# Patient Record
Sex: Female | Born: 1995 | Race: White | Hispanic: No | Marital: Single | Smoking: Never smoker
Health system: Southern US, Community
[De-identification: ages and names within clinical notes are randomized; demographics above are authoritative.]

## PROBLEM LIST (undated history)

## (undated) ENCOUNTER — Inpatient Hospital Stay (HOSPITAL_COMMUNITY): Payer: Self-pay

## (undated) DIAGNOSIS — N390 Urinary tract infection, site not specified: Secondary | ICD-10-CM

## (undated) DIAGNOSIS — K589 Irritable bowel syndrome without diarrhea: Secondary | ICD-10-CM

## (undated) DIAGNOSIS — J302 Other seasonal allergic rhinitis: Secondary | ICD-10-CM

## (undated) DIAGNOSIS — T7840XA Allergy, unspecified, initial encounter: Secondary | ICD-10-CM

## (undated) DIAGNOSIS — F32A Depression, unspecified: Secondary | ICD-10-CM

## (undated) DIAGNOSIS — F419 Anxiety disorder, unspecified: Secondary | ICD-10-CM

## (undated) HISTORY — DX: Urinary tract infection, site not specified: N39.0

## (undated) HISTORY — DX: Allergy, unspecified, initial encounter: T78.40XA

## (undated) HISTORY — DX: Irritable bowel syndrome, unspecified: K58.9

## (undated) HISTORY — DX: Anxiety disorder, unspecified: F41.9

## (undated) HISTORY — DX: Other seasonal allergic rhinitis: J30.2

## (undated) HISTORY — DX: Depression, unspecified: F32.A

---
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
## 2014-04-15 ENCOUNTER — Encounter: Payer: Self-pay | Admitting: Podiatry

## 2014-04-15 ENCOUNTER — Other Ambulatory Visit: Payer: Self-pay | Admitting: Podiatry

## 2015-01-28 ENCOUNTER — Encounter (HOSPITAL_BASED_OUTPATIENT_CLINIC_OR_DEPARTMENT_OTHER): Payer: Self-pay | Admitting: Emergency Medicine

## 2015-01-28 ENCOUNTER — Emergency Department (HOSPITAL_BASED_OUTPATIENT_CLINIC_OR_DEPARTMENT_OTHER)
Admission: EM | Admit: 2015-01-28 | Discharge: 2015-01-29 | Disposition: A | Discharge status: Home / Self Care | Payer: 59 | Attending: Emergency Medicine | Admitting: Emergency Medicine

## 2015-01-28 DIAGNOSIS — Z880 Allergy status to penicillin: Secondary | ICD-10-CM | POA: Diagnosis not present

## 2015-01-28 DIAGNOSIS — Y9289 Other specified places as the place of occurrence of the external cause: Secondary | ICD-10-CM | POA: Insufficient documentation

## 2015-01-28 DIAGNOSIS — Z3202 Encounter for pregnancy test, result negative: Secondary | ICD-10-CM | POA: Insufficient documentation

## 2015-01-28 DIAGNOSIS — R110 Nausea: Secondary | ICD-10-CM | POA: Insufficient documentation

## 2015-01-28 DIAGNOSIS — S39012A Strain of muscle, fascia and tendon of lower back, initial encounter: Secondary | ICD-10-CM | POA: Diagnosis not present

## 2015-01-28 DIAGNOSIS — Z87440 Personal history of urinary (tract) infections: Secondary | ICD-10-CM | POA: Insufficient documentation

## 2015-01-28 DIAGNOSIS — Z79899 Other long term (current) drug therapy: Secondary | ICD-10-CM | POA: Insufficient documentation

## 2015-01-28 DIAGNOSIS — Y998 Other external cause status: Secondary | ICD-10-CM | POA: Diagnosis not present

## 2015-01-28 DIAGNOSIS — Y9389 Activity, other specified: Secondary | ICD-10-CM | POA: Diagnosis not present

## 2015-01-28 DIAGNOSIS — R109 Unspecified abdominal pain: Secondary | ICD-10-CM | POA: Diagnosis present

## 2015-01-28 DIAGNOSIS — X58XXXA Exposure to other specified factors, initial encounter: Secondary | ICD-10-CM | POA: Diagnosis not present

## 2015-01-28 LAB — URINALYSIS, ROUTINE W REFLEX MICROSCOPIC
Bilirubin Urine: NEGATIVE
Glucose, UA: NEGATIVE mg/dL
Hgb urine dipstick: NEGATIVE
Ketones, ur: NEGATIVE mg/dL
Leukocytes, UA: NEGATIVE
Nitrite: NEGATIVE
Protein, ur: NEGATIVE mg/dL
Specific Gravity, Urine: 1.016 (ref 1.005–1.030)
Urobilinogen, UA: 1 mg/dL (ref 0.0–1.0)
pH: 7 (ref 5.0–8.0)

## 2015-01-28 LAB — PREGNANCY, URINE: Preg Test, Ur: NEGATIVE

## 2015-01-28 MED ORDER — IBUPROFEN 400 MG PO TABS
600.0000 mg | ORAL_TABLET | Freq: Once | ORAL | Status: AC
  Administered 2015-01-28: 600 mg via ORAL
  Filled 2015-01-28 (×2): qty 1

## 2015-01-28 MED ORDER — IBUPROFEN 600 MG PO TABS: 600.0000 mg | ORAL_TABLET | Freq: Four times a day (QID) | ORAL | Status: DC | PRN

## 2015-01-28 MED ORDER — METHOCARBAMOL 500 MG PO TABS
1000.0000 mg | ORAL_TABLET | Freq: Once | ORAL | Status: AC
  Administered 2015-01-28: 1000 mg via ORAL
  Filled 2015-01-28: qty 2

## 2015-01-28 MED ORDER — METHOCARBAMOL 500 MG PO TABS: 1000.0000 mg | ORAL_TABLET | Freq: Three times a day (TID) | ORAL | Status: DC | PRN

## 2015-01-28 NOTE — ED Provider Notes (Signed)
CSN: [id number]     Arrival date & time 01/28/15  2205 History  This chart was scribed for Loren Raceravid Khilynn Borntreger, MD by Murriel HopperAlec Bankhead, ED Scribe. This patient was seen in room MH03/MH03 and the patient's care was started at 11:36 PM.  Chief Complaint  Patient presents with  . Flank Pain      Patient is a 19 y.o. female presenting with flank pain. The history is provided by the patient and a parent. No language interpreter was used.  Flank Pain This is a new problem. The current episode started more than 2 days ago. The problem occurs constantly. The problem has not changed since onset.Pertinent negatives include no abdominal pain, no headaches and no shortness of breath. The symptoms are aggravated by twisting, stress and exertion.    HPI Comments: Kimberly Fuentes is a 19 y.o. female who presents to the Emergency Department complaining of intermittent left flank pain with associated dysuria and nausea that has been present for about a week. Her mother states that she was diagnosed with a UTI last week and received antibiotics for them, for which she has 2 days of antibiotics left. Pt states that her dysuria is improving, and notes she has not had any urinary symptoms today, but notes that her flank pain is still persistent. Pt reports she tried to play in a softball game earlier this evening and could not play because of her flank pain. Pain does not radiate. No focal weakness or numbness. No incontinence     History reviewed. No pertinent past medical history. Past Surgical History  Procedure Laterality Date  . Wisdom tooth extraction  2012   No family history on file. History  Substance Use Topics  . Smoking status: Passive Smoke Exposure - Never Smoker  . Smokeless tobacco: Not on file  . Alcohol Use: No   OB History    No data available     Review of Systems  Constitutional: Negative for fever and chills.  Respiratory: Negative for shortness of breath.   Gastrointestinal:  Positive for nausea. Negative for vomiting, abdominal pain and diarrhea.  Genitourinary: Positive for flank pain. Negative for dysuria, frequency, difficulty urinating and pelvic pain.  Musculoskeletal: Positive for myalgias and back pain. Negative for neck pain.  Skin: Negative for rash and wound.  Neurological: Negative for dizziness, weakness, light-headedness, numbness and headaches.  All other systems reviewed and are negative.     Allergies  Cefazolin and Penicillins  Home Medications   Prior to Admission medications   Medication Sig Start Date End Date Taking? Authorizing Provider  ibuprofen (ADVIL,MOTRIN) 600 MG tablet Take 1 tablet (600 mg total) by mouth every 6 (six) hours as needed. 01/28/15   Loren Raceravid Tequita Marrs, MD  methocarbamol (ROBAXIN) 500 MG tablet Take 2 tablets (1,000 mg total) by mouth every 8 (eight) hours as needed for muscle spasms. 01/28/15   Loren Raceravid Praise Stennett, MD  nitrofurantoin (MACRODANTIN) 100 MG capsule Take 100 mg by mouth 2 (two) times daily.   Yes Historical Provider, MD  ondansetron (ZOFRAN) 4 MG tablet Take 4 mg by mouth every 8 (eight) hours as needed for nausea.   Yes Historical Provider, MD   BP 129/65 mmHg  Pulse 65  Temp(Src) 98.7 F (37.1 C) (Oral)  Resp 16  Ht 5\' 1"  (1.549 m)  Wt 130 lb (58.968 kg)  BMI 24.58 kg/m2  SpO2 100%  LMP 01/14/2015 (Exact Date) Physical Exam  Constitutional: She is oriented to person, place, and time. She appears well-developed  and well-nourished. No distress.  Very well-appearing  HENT:  Head: Normocephalic and atraumatic.  Mouth/Throat: Oropharynx is clear and moist.  Eyes: EOM are normal. Pupils are equal, round, and reactive to light.  Neck: Normal range of motion. Neck supple.  Cardiovascular: Normal rate and regular rhythm.   Pulmonary/Chest: Effort normal and breath sounds normal. No respiratory distress. She has no wheezes. She has no rales.  Abdominal: Soft. Bowel sounds are normal. She exhibits no  distension and no mass. There is no tenderness. There is no rebound and no guarding.  Musculoskeletal: Normal range of motion. She exhibits tenderness. She exhibits no edema.  No CVA tenderness bilaterally. The patient does have inferior left-sided paraspinal lumbar tenderness. No midline tenderness or step-offs.  Neurological: She is alert and oriented to person, place, and time.  Skin: Skin is warm and dry. No rash noted. No erythema.  Psychiatric: She has a normal mood and affect. Her behavior is normal.  Nursing note and vitals reviewed.   ED Course  Procedures (including critical care time)  DIAGNOSTIC STUDIES: Oxygen Saturation is 100% on room air, normal by my interpretation.    COORDINATION OF CARE: 11:39 PM Discussed treatment plan with pt at bedside and pt agreed to plan.   Labs Review Labs Reviewed  URINALYSIS, ROUTINE W REFLEX MICROSCOPIC (NOT AT [facility]) - Abnormal; Notable for the following:    APPearance CLOUDY (*)    All other components within normal limits  PREGNANCY, URINE    Imaging Review No results found.   EKG Interpretation None      MDM   Final diagnoses:  Lumbar strain, initial encounter    I personally performed the services described in this documentation, which was scribed in my presence. The recorded information has been reviewed and is accurate.  Urine without evidence of infection. Patient is tender over the paraspinal muscles in the lumbar region. Symptoms are likely due to muscle strain/spasm. Heat use as advised. We'll treat symptomatically. Return precautions given.     Loren Racer, MD 01/29/15 [phone]

## 2015-01-28 NOTE — Discharge Instructions (Signed)

## 2015-01-28 NOTE — ED Notes (Signed)
19 yo dx with severe UTI last week. C/o left flank pain and persistent burning with urination.

## 2015-03-27 ENCOUNTER — Ambulatory Visit (INDEPENDENT_AMBULATORY_CARE_PROVIDER_SITE_OTHER): Payer: 59 | Admitting: Family Medicine

## 2015-03-27 ENCOUNTER — Encounter: Payer: Self-pay | Admitting: Family Medicine

## 2015-03-27 VITALS — BP 110/74 | HR 77 | Temp 98.2°F | Resp 18 | Ht 62.5 in | Wt 128.0 lb

## 2015-03-27 DIAGNOSIS — F633 Trichotillomania: Secondary | ICD-10-CM | POA: Diagnosis not present

## 2015-03-27 DIAGNOSIS — J019 Acute sinusitis, unspecified: Secondary | ICD-10-CM | POA: Insufficient documentation

## 2015-03-27 HISTORY — DX: Trichotillomania: F63.3

## 2015-03-27 MED ORDER — FLUTICASONE PROPIONATE 50 MCG/ACT NA SUSP: 2.0000 | Freq: Every day | NASAL | Status: DC

## 2015-03-27 NOTE — Patient Instructions (Signed)
Upper Respiratory Infection, Adult An upper respiratory infection (URI) is also sometimes known as the common cold. The upper respiratory tract includes the nose, sinuses, throat, trachea, and bronchi. Bronchi are the airways leading to the lungs. Most people improve within 1 week, but symptoms can last up to 2 weeks. A residual cough may last even longer.  CAUSES Many different viruses can infect the tissues lining the upper respiratory tract. The tissues become irritated and inflamed and often become very moist. Mucus production is also common. A cold is contagious. You can easily spread the virus to others by oral contact. This includes kissing, sharing a glass, coughing, or sneezing. Touching your mouth or nose and then touching a surface, which is then touched by another person, can also spread the virus. SYMPTOMS  Symptoms typically develop 1 to 3 days after you come in contact with a cold virus. Symptoms vary from person to person. They may include:  Runny nose.  Sneezing.  Nasal congestion.  Sinus irritation.  Sore throat.  Loss of voice (laryngitis).  Cough.  Fatigue.  Muscle aches.  Loss of appetite.  Headache.  Low-grade fever. DIAGNOSIS  You might diagnose your own cold based on familiar symptoms, since most people get a cold 2 to 3 times a year. Your caregiver can confirm this based on your exam. Most importantly, your caregiver can check that your symptoms are not due to another disease such as strep throat, sinusitis, pneumonia, asthma, or epiglottitis. Blood tests, throat tests, and X-rays are not necessary to diagnose a common cold, but they may sometimes be helpful in excluding other more serious diseases. Your caregiver will decide if any further tests are required. RISKS AND COMPLICATIONS  You may be at risk for a more severe case of the common cold if you smoke cigarettes, have chronic heart disease (such as heart failure) or lung disease (such as asthma), or if  you have a weakened immune system. The very young and very old are also at risk for more serious infections. Bacterial sinusitis, middle ear infections, and bacterial pneumonia can complicate the common cold. The common cold can worsen asthma and chronic obstructive pulmonary disease (COPD). Sometimes, these complications can require emergency medical care and may be life-threatening. PREVENTION  The best way to protect against getting a cold is to practice good hygiene. Avoid oral or hand contact with people with cold symptoms. Wash your hands often if contact occurs. There is no clear evidence that vitamin C, vitamin E, echinacea, or exercise reduces the chance of developing a cold. However, it is always recommended to get plenty of rest and practice good nutrition. TREATMENT  Treatment is directed at relieving symptoms. There is no cure. Antibiotics are not effective, because the infection is caused by a virus, not by bacteria. Treatment may include:  Increased fluid intake. Sports drinks offer valuable electrolytes, sugars, and fluids.  Breathing heated mist or steam (vaporizer or shower).  Eating chicken soup or other clear broths, and maintaining good nutrition.  Getting plenty of rest.  Using gargles or lozenges for comfort.  Controlling fevers with ibuprofen or acetaminophen as directed by your caregiver.  Increasing usage of your inhaler if you have asthma. Zinc gel and zinc lozenges, taken in the first 24 hours of the common cold, can shorten the duration and lessen the severity of symptoms. Pain medicines may help with fever, muscle aches, and throat pain. A variety of non-prescription medicines are available to treat congestion and runny nose. Your caregiver   can make recommendations and may suggest nasal or lung inhalers for other symptoms.  HOME CARE INSTRUCTIONS   Only take over-the-counter or prescription medicines for pain, discomfort, or fever as directed by your  caregiver.  Use a warm mist humidifier or inhale steam from a shower to increase air moisture. This may keep secretions moist and make it easier to breathe.  Drink enough water and fluids to keep your urine clear or pale yellow.  Rest as needed.  Return to work when your temperature has returned to normal or as your caregiver advises. You may need to stay home longer to avoid infecting others. You can also use a face mask and careful hand washing to prevent spread of the virus. SEEK MEDICAL CARE IF:   After the first few days, you feel you are getting worse rather than better.  You need your caregiver's advice about medicines to control symptoms.  You develop chills, worsening shortness of breath, or brown or red sputum. These may be signs of pneumonia.  You develop yellow or brown nasal discharge or pain in the face, especially when you bend forward. These may be signs of sinusitis.  You develop a fever, swollen neck glands, pain with swallowing, or white areas in the back of your throat. These may be signs of strep throat. SEEK IMMEDIATE MEDICAL CARE IF:   You have a fever.  You develop severe or persistent headache, ear pain, sinus pain, or chest pain.  You develop wheezing, a prolonged cough, cough up blood, or have a change in your usual mucus (if you have chronic lung disease).  You develop sore muscles or a stiff neck. Document Released: 01/05/2001 Document Revised: 10/04/2011 Document Reviewed: 10/17/2013 ExitCare Patient Information 2015 [employer]. This information is not intended to replace advice given to you by your health care provider. Make sure you discuss any questions you have with your health care provider.  

## 2015-03-27 NOTE — Progress Notes (Signed)
Subjective:    Patient ID: Kimberly Fuentes, female    DOB: 11/20/1995, 19 y.o.   MRN: [id number]  HPI  Patient presents to establish care with complaints of sore throat  Sore throat: Patient states that she's had a sore throat for approximately 7 days, with rhinorrhea, congestion, mild facial pressure in her forehead with headache. She denies any exposure to sick contacts. She does not take an histamine, but has tried Sudafed and a cold and sinus medication. She denies any fever, chills, rash, diarrhea, nausea, vomiting and she is tolerating by mouth.  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 Initially she would pull out her eyelashes, but she has been able to resist the temptation's. She is however started pulling on her hair, and she is "working "on this now.  Patient is a new patient, all medical history was recorded today, including past medical history, allergies, past surgical history, family history and social history.  Past Medical History  Diagnosis Date  . UTI (lower urinary tract infection)   . Allergy    Allergies  Allergen Reactions  . Cefazolin Hives  . Penicillins Hives  . Hydrocodone Itching    Facial itching when taking after wisdom teeth extraction   Past Surgical History  Procedure Laterality Date  . Wisdom tooth extraction  2012  . Tonsillectomy and adenoidectomy  2000   Family History  Problem Relation Age of Onset  . Miscarriages / [place] Mother    Social History   Social History  . Marital Status: Single    Spouse Name: N/A  . Number of Children: N/A  . Years of Education: N/A   Occupational History  . Not on file.   Social History Main Topics  . Smoking status: Passive Smoke Exposure - Never Smoker  . Smokeless tobacco: Not on file  . Alcohol Use: No  . Drug Use: No  . Sexual Activity: Yes    Birth Control/ Protection: None, Condom   Other Topics  Concern  . Not on file   Social History Narrative   Wears seat belt, wears bike helmet. Feels safe. Works at [employer]. Archivist, TEFL teacher.     Review of Systems  Constitutional: Positive for activity change and fatigue. Negative for fever, chills, appetite change and unexpected weight change.  HENT: Positive for congestion, ear pain, postnasal drip, rhinorrhea, sinus pressure and sore throat. Negative for dental problem, ear discharge, facial swelling, hearing loss, nosebleeds, sneezing, tinnitus, trouble swallowing and voice change.   Eyes: Negative for pain, redness and visual disturbance.  Respiratory: Negative for cough, chest tightness, shortness of breath and wheezing.   Cardiovascular: Negative for chest pain, palpitations and leg swelling.  Gastrointestinal: Negative for nausea, vomiting, abdominal pain, diarrhea and constipation.  Endocrine: Negative for polyuria.  Genitourinary: Negative for dysuria, urgency, frequency and hematuria.  Musculoskeletal: Negative for myalgias and arthralgias.  Skin: Negative for rash.  Allergic/Immunologic: Negative.   Neurological: Positive for headaches. Negative for weakness.  Hematological: Negative.       Objective:   Physical Exam BP 110/74 mmHg  Pulse 77  Temp(Src) 98.2 F (36.8 C) (Oral)  Resp 18  Ht 5' 2.5" (1.588 m)  Wt 128 lb (58.06 kg)  BMI 23.02 kg/m2  SpO2 98%  LMP 03/14/2015 Gen: Afebrile. No acute distress. Appears fatigued, coughing mildly dry on exam. Congestion during exam. HENT: AT. [place]. Bilateral TM visualized and normal in appearance.  MMM. Bilateral nares with erythema and swelling. Throat without erythema or exudates.  Eyes:Pupils Equal Round Reactive to light, Extraocular movements intact, Conjunctiva without redness, discharge or icterus. Neck: Supple, mild cervical anterior lymphadenopathy CV: RRR no murmur appreciated Chest: CTAB, no wheeze or crackles Abd: Soft. Flat. NTND. BS present. No Masses  palpated.  Skin: No rashes, purpura or petechiae.      Assessment & Plan:  1. 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  2. Acute rhinosinusitis - Rest, hydrate, Advil as needed, Mucinex over-the-counter, Flonase. - fluticasone (FLONASE) 50 MCG/ACT nasal spray; Place 2 sprays into both nostrils daily.  Dispense: 16 g; Refill: 1 - Follow-up in one week if no improvement.  3. Health maintenance: Patient declined flu shot today, records will need to be retrieved to see if she needs the tetanus shot.

## 2015-05-01 ENCOUNTER — Encounter: Payer: 59 | Admitting: Family Medicine

## 2015-05-01 ENCOUNTER — Ambulatory Visit (INDEPENDENT_AMBULATORY_CARE_PROVIDER_SITE_OTHER): Payer: 59 | Admitting: Family Medicine

## 2015-05-01 ENCOUNTER — Encounter: Payer: Self-pay | Admitting: Family Medicine

## 2015-05-01 VITALS — BP 112/77 | HR 77 | Temp 98.4°F | Resp 18 | Ht 62.0 in | Wt 129.0 lb

## 2015-05-01 DIAGNOSIS — Z23 Encounter for immunization: Secondary | ICD-10-CM

## 2015-05-01 DIAGNOSIS — Z0000 Encounter for general adult medical examination without abnormal findings: Secondary | ICD-10-CM | POA: Insufficient documentation

## 2015-05-01 DIAGNOSIS — Z309 Encounter for contraceptive management, unspecified: Secondary | ICD-10-CM

## 2015-05-01 DIAGNOSIS — Z299 Encounter for prophylactic measures, unspecified: Secondary | ICD-10-CM | POA: Diagnosis not present

## 2015-05-01 DIAGNOSIS — F633 Trichotillomania: Secondary | ICD-10-CM

## 2015-05-01 DIAGNOSIS — Z3009 Encounter for other general counseling and advice on contraception: Secondary | ICD-10-CM | POA: Insufficient documentation

## 2015-05-01 LAB — POCT URINE PREGNANCY: Preg Test, Ur: NEGATIVE

## 2015-05-01 MED ORDER — MEDROXYPROGESTERONE ACETATE 150 MG/ML IM SUSP
150.0000 mg | Freq: Once | INTRAMUSCULAR | Status: AC
  Administered 2015-05-01: 150 mg via INTRAMUSCULAR

## 2015-05-01 NOTE — Progress Notes (Signed)
Subjective:    Patient ID: Kimberly Fuentes, female    DOB: 1995-08-12, 19 y.o.   MRN: [id number]  HPI Patient presents today for her physical, with questions of birth control counseling, otherwise patient has no complaints.Past medical history, family history, medications, immunizations, surgical history and social history were updated during the office visit.  Birth control counseling: Patient states she would like to be started on birth control method. She does not feel that she could remember to take a birth control pill every day. After counseling, she is not interested in any type of implant. She thinks the Depo-Provera injection every 3 months would work best for her. Currently she states her menstrual period is irregular, it can occur anywhere between 21 and 45 days. She states her menstrual cycles have always been this way. They last for approximately 1 week. She states they are only heavy on the third day and then they are normal. She denies any vaginal irritation, discharge or pain. She has become sexually active, in a monogamous relationship with a female partner. She has no breast or colon cancer history in her family. She is currently using condoms for protection.  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 She has been seeing a psychiatrist at school. She has transferred back to the [place] area, and is starting to go to [facility]. She is asking for referral to psychiatrist in the area. She states she was tried on Prozac in the past, she is uncertain about the dosage. She did not feel that the Prozac was helpful for her. She does get great benefit out of therapy. She states her last "pulling" occurred 1 week ago. She has been starting to pull the hair on her scalp.  Health maintenance:  Colonoscopy: No family history, routine screening at age 68 Mammogram: No family history, routine screening at age 46-50 Cervical cancer screening: Not indicated, start at 89 with  Pap smears Immunizations: Td UTD due 2019, flu vaccination indicated.  Infectious disease screening: Chlamydia screening if needed, and with Pap smears at 21. HIV will be offered with next lab draw.  Past Medical History  Diagnosis Date  . UTI (lower urinary tract infection)   . Allergy    Allergies  Allergen Reactions  . Cefazolin Hives  . Penicillins Hives  . Hydrocodone Itching    Facial itching when taking after wisdom teeth extraction   Past Surgical History  Procedure Laterality Date  . Wisdom tooth extraction  2012  . Tonsillectomy and adenoidectomy  2000    Family History  Problem Relation Age of Onset  . Miscarriages / [place] Mother    Social History   Social History  . Marital Status: Single    Spouse Name: N/A  . Number of Children: N/A  . Years of Education: N/A   Occupational History  . Not on file.   Social History Main Topics  . Smoking status: Passive Smoke Exposure - Never Smoker  . Smokeless tobacco: Not on file  . Alcohol Use: No  . Drug Use: No  . Sexual Activity: Yes    Birth Control/ Protection: None, Condom, Injection     Comment: depo   Other Topics Concern  . Not on file   Social History Narrative   Wears seat belt, wears bike helmet. Feels safe in her relationships. Works at [employer].    Designer, television/film set, applying for surgical tech   - [employer]   - No etoh, drugs or smoker.    -  In a relationship   - Patient has a dental home, with every 6 month check ups.   Review of Systems Negative, with the exception of above mentioned in HPI     Objective:   Physical Exam BP 112/77 mmHg  Pulse 77  Temp(Src) 98.4 F (36.9 C) (Oral)  Resp 18  Ht  (1.575 m)  Wt 129 lb (58.514 kg)  BMI 23.59 kg/m2  SpO2 98%  LMP 04/06/2015 Gen: Afebrile. No acute distress. Nontoxic appearance, well-developed, well-nourished, Caucasian female. Physically fit. Pleasant. Makes good eye contact. Eyelashes and eyebrows intact. Hair appears  thin. HENT: AT. [place]. Bilateral TM visualized and normal in appearance. MMM. Bilateral nares without erythema or swelling. Throat without erythema or exudates.  Eyes:Pupils Equal Round Reactive to light, Extraocular movements intact,  Conjunctiva without redness, discharge or icterus. Neck/lymp/endocrine: Supple, no lymphadenopathy, no thyromegaly CV: RRR no murmur appreciated, no edema, +2/4 P posterior tibialis pulses Chest: CTAB, no wheeze or crackles Abd: Soft. Flat. NTND. BS present. No Masses palpated.  Skin: No rashes, purpura or petechiae. ~ 5 caf au lait spots over her body. Neuro: Normal gait. PERLA. EOMi. Alert. Oriented x3.  Cranial nerves II through XII intact. No focal deficits. Muscle strength 5/5 upper and lower extremity. DTRs equal bilaterally. Psych: Normal affect, dress and demeanor. Normal speech. Normal thought content and judgment..      Assessment & Plan:  1. Need for prophylactic vaccination and inoculation against influenza - Flu Vaccine QUAD 36+ mos IM  2. Counseling for initiation of birth control method - Patient was presented with different options of birth control method today, counseled extensively. Patient decided on Depo-Provera as her birth control method. Patient counseled on side effects of Depo-Provera, schedule to maintain protection. Use of condoms needed for protection against sexually transmitted diseases. - POCT urine pregnancy --> negative - medroxyPROGESTERone (DEPO-PROVERA) injection 150 mg; Inject 1 mL (150 mg total) into the muscle once. - Follow-up every 3 months with nurse visit for Depo-Provera.  3. Trichotillomania - Patient needing psychiatry referral today for therapy. She had a therapist that was at her prior location, but now needs someone locally. - Patient continues to struggle with her trichotillomania, last event she said was approximately 1 week ago, and she has now pulling her hair. Patient has been tried on medication therapy in  the past with Prozac, she states she never felt the medication helped but doesn't know what dose she was on. Patient benefits from continued therapy and needs to have her referral placed as soon as possible, and preferably with a female provider. - Psychiatry referral placed today.  1 year provider follow-up, sooner if needed Every 3 months for Depo-Provera nurse visit

## 2015-05-01 NOTE — Patient Instructions (Signed)
Health Maintenance, Female Adopting a healthy lifestyle and getting preventive care can go a long way to promote health and wellness. Talk with your health care provider about what schedule of regular examinations is right for you. This is a good chance for you to check in with your provider about disease prevention and staying healthy. In between checkups, there are plenty of things you can do on your own. Experts have done a lot of research about which lifestyle changes and preventive measures are most likely to keep you healthy. Ask your health care provider for more information. WEIGHT AND DIET  Eat a healthy diet  Be sure to include plenty of vegetables, fruits, low-fat dairy products, and lean protein.  Do not eat a lot of foods high in solid fats, added sugars, or salt.  Get regular exercise. This is one of the most important things you can do for your health.  Most adults should exercise for at least 150 minutes each week. The exercise should increase your heart rate and make you sweat (moderate-intensity exercise).  Most adults should also do strengthening exercises at least twice a week. This is in addition to the moderate-intensity exercise.  Maintain a healthy weight  Body mass index (BMI) is a measurement that can be used to identify possible weight problems. It estimates body fat based on height and weight. Your health care provider can help determine your BMI and help you achieve or maintain a healthy weight.  For females 20 years of age and older:   A BMI below 18.5 is considered underweight.  A BMI of 18.5 to 24.9 is normal.  A BMI of 25 to 29.9 is considered overweight.  A BMI of 30 and above is considered obese.  Watch levels of cholesterol and blood lipids  You should start having your blood tested for lipids and cholesterol at 20 years of age, then have this test every 5 years.  You may need to have your cholesterol levels checked more often if:  Your lipid  or cholesterol levels are high.  You are older than 20 years of age.  You are at high risk for heart disease.  CANCER SCREENING   Lung Cancer  Lung cancer screening is recommended for adults 55-80 years old who are at high risk for lung cancer because of a history of smoking.  A yearly low-dose CT scan of the lungs is recommended for people who:  Currently smoke.  Have quit within the past 15 years.  Have at least a 30-pack-year history of smoking. A pack year is smoking an average of one pack of cigarettes a day for 1 year.  Yearly screening should continue until it has been 15 years since you quit.  Yearly screening should stop if you develop a health problem that would prevent you from having lung cancer treatment.  Breast Cancer  Practice breast self-awareness. This means understanding how your breasts normally appear and feel.  It also means doing regular breast self-exams. Let your health care provider know about any changes, no matter how small.  If you are in your 20s or 30s, you should have a clinical breast exam (CBE) by a health care provider every 1-3 years as part of a regular health exam.  If you are 40 or older, have a CBE every year. Also consider having a breast X-ray (mammogram) every year.  If you have a family history of breast cancer, talk to your health care provider about genetic screening.  If you   are at high risk for breast cancer, talk to your health care provider about having an MRI and a mammogram every year.  Breast cancer gene (BRCA) assessment is recommended for women who have family members with BRCA-related cancers. BRCA-related cancers include:  Breast.  Ovarian.  Tubal.  Peritoneal cancers.  Results of the assessment will determine the need for genetic counseling and BRCA1 and BRCA2 testing. Cervical Cancer Your health care provider may recommend that you be screened regularly for cancer of the pelvic organs (ovaries, uterus, and  vagina). This screening involves a pelvic examination, including checking for microscopic changes to the surface of your cervix (Pap test). You may be encouraged to have this screening done every 3 years, beginning at age 21.  For women ages 30-65, health care providers may recommend pelvic exams and Pap testing every 3 years, or they may recommend the Pap and pelvic exam, combined with testing for human papilloma virus (HPV), every 5 years. Some types of HPV increase your risk of cervical cancer. Testing for HPV may also be done on women of any age with unclear Pap test results.  Other health care providers may not recommend any screening for nonpregnant women who are considered low risk for pelvic cancer and who do not have symptoms. Ask your health care provider if a screening pelvic exam is right for you.  If you have had past treatment for cervical cancer or a condition that could lead to cancer, you need Pap tests and screening for cancer for at least 20 years after your treatment. If Pap tests have been discontinued, your risk factors (such as having a new sexual partner) need to be reassessed to determine if screening should resume. Some women have medical problems that increase the chance of getting cervical cancer. In these cases, your health care provider may recommend more frequent screening and Pap tests. Colorectal Cancer  This type of cancer can be detected and often prevented.  Routine colorectal cancer screening usually begins at 20 years of age and continues through 20 years of age.  Your health care provider may recommend screening at an earlier age if you have risk factors for colon cancer.  Your health care provider may also recommend using home test kits to check for hidden blood in the stool.  A small camera at the end of a tube can be used to examine your colon directly (sigmoidoscopy or colonoscopy). This is done to check for the earliest forms of colorectal  cancer.  Routine screening usually begins at age 50.  Direct examination of the colon should be repeated every 5-10 years through 20 years of age. However, you may need to be screened more often if early forms of precancerous polyps or small growths are found. Skin Cancer  Check your skin from head to toe regularly.  Tell your health care provider about any new moles or changes in moles, especially if there is a change in a mole's shape or color.  Also tell your health care provider if you have a mole that is larger than the size of a pencil eraser.  Always use sunscreen. Apply sunscreen liberally and repeatedly throughout the day.  Protect yourself by wearing long sleeves, pants, a wide-brimmed hat, and sunglasses whenever you are outside. HEART DISEASE, DIABETES, AND HIGH BLOOD PRESSURE   High blood pressure causes heart disease and increases the risk of stroke. High blood pressure is more likely to develop in:  People who have blood pressure in the high end   of the normal range (130-139/85-89 mm Hg).  People who are overweight or obese.  People who are African American.  If you are 38-23 years of age, have your blood pressure checked every 3-5 years. If you are 61 years of age or older, have your blood pressure checked every year. You should have your blood pressure measured twice--once when you are at a hospital or clinic, and once when you are not at a hospital or clinic. Record the average of the two measurements. To check your blood pressure when you are not at a hospital or clinic, you can use:  An automated blood pressure machine at a pharmacy.  A home blood pressure monitor.  If you are between 45 years and 39 years old, ask your health care provider if you should take aspirin to prevent strokes.  Have regular diabetes screenings. This involves taking a blood sample to check your fasting blood sugar level.  If you are at a normal weight and have a low risk for diabetes,  have this test once every three years after 20 years of age.  If you are overweight and have a high risk for diabetes, consider being tested at a younger age or more often. PREVENTING INFECTION  Hepatitis B  If you have a higher risk for hepatitis B, you should be screened for this virus. You are considered at high risk for hepatitis B if:  You were born in a country where hepatitis B is common. Ask your health care provider which countries are considered high risk.  Your parents were born in a high-risk country, and you have not been immunized against hepatitis B (hepatitis B vaccine).  You have HIV or AIDS.  You use needles to inject street drugs.  You live with someone who has hepatitis B.  You have had sex with someone who has hepatitis B.  You get hemodialysis treatment.  You take certain medicines for conditions, including cancer, organ transplantation, and autoimmune conditions. Hepatitis C  Blood testing is recommended for:  Everyone born from [birth year] through 1966.  Anyone with known risk factors for hepatitis C. Sexually transmitted infections (STIs)  You should be screened for sexually transmitted infections (STIs) including gonorrhea and chlamydia if:  You are sexually active and are younger than 20 years of age.  You are older than 20 years of age and your health care provider tells you that you are at risk for this type of infection.  Your sexual activity has changed since you were last screened and you are at an increased risk for chlamydia or gonorrhea. Ask your health care provider if you are at risk.  If you do not have HIV, but are at risk, it may be recommended that you take a prescription medicine daily to prevent HIV infection. This is called pre-exposure prophylaxis (PrEP). You are considered at risk if:  You are sexually active and do not regularly use condoms or know the HIV status of your partner(s).  You take drugs by injection.  You are sexually  active with a partner who has HIV. Talk with your health care provider about whether you are at high risk of being infected with HIV. If you choose to begin PrEP, you should first be tested for HIV. You should then be tested every 3 months for as long as you are taking PrEP.  PREGNANCY   If you are premenopausal and you may become pregnant, ask your health care provider about preconception counseling.  If you may  become pregnant, take 400 to 800 micrograms (mcg) of folic acid every day.  If you want to prevent pregnancy, talk to your health care provider about birth control (contraception). OSTEOPOROSIS AND MENOPAUSE   Osteoporosis is a disease in which the bones lose minerals and strength with aging. This can result in serious bone fractures. Your risk for osteoporosis can be identified using a bone density scan.  If you are 22 years of age or older, or if you are at risk for osteoporosis and fractures, ask your health care provider if you should be screened.  Ask your health care provider whether you should take a calcium or vitamin D supplement to lower your risk for osteoporosis.  Menopause may have certain physical symptoms and risks.  Hormone replacement therapy may reduce some of these symptoms and risks. Talk to your health care provider about whether hormone replacement therapy is right for you.  HOME CARE INSTRUCTIONS   Schedule regular health, dental, and eye exams.  Stay current with your immunizations.   Do not use any tobacco products including cigarettes, chewing tobacco, or electronic cigarettes.  If you are pregnant, do not drink alcohol.  If you are breastfeeding, limit how much and how often you drink alcohol.  Limit alcohol intake to no more than 1 drink per day for nonpregnant women. One drink equals 12 ounces of beer, 5 ounces of wine, or 1 ounces of hard liquor.  Do not use street drugs.  Do not share needles.  Ask your health care provider for help if  you need support or information about quitting drugs.  Tell your health care provider if you often feel depressed.  Tell your health care provider if you have ever been abused or do not feel safe at home.   This information is not intended to replace advice given to you by your health care provider. Make sure you discuss any questions you have with your health care provider.   Document Released: 01/25/2011 Document Revised: 08/02/2014 Document Reviewed: 06/13/2013 Elsevier Interactive Patient Education [employer].  1 year unless needed sooner for provider visit Every 3  Months nurse visit for Depo provera

## 2015-05-01 NOTE — Progress Notes (Signed)
Pre visit review using our clinic review tool, if applicable. No additional management support is needed unless otherwise documented below in the visit note. 

## 2015-06-17 ENCOUNTER — Encounter: Payer: 59 | Admitting: Family Medicine

## 2015-06-30 ENCOUNTER — Encounter: Payer: Self-pay | Admitting: Family Medicine

## 2015-06-30 ENCOUNTER — Ambulatory Visit (INDEPENDENT_AMBULATORY_CARE_PROVIDER_SITE_OTHER): Payer: 59 | Admitting: Family Medicine

## 2015-06-30 VITALS — BP 120/81 | HR 89 | Temp 98.7°F | Resp 20 | Wt 135.2 lb

## 2015-06-30 DIAGNOSIS — J029 Acute pharyngitis, unspecified: Secondary | ICD-10-CM | POA: Diagnosis not present

## 2015-06-30 DIAGNOSIS — J019 Acute sinusitis, unspecified: Secondary | ICD-10-CM | POA: Insufficient documentation

## 2015-06-30 DIAGNOSIS — J0110 Acute frontal sinusitis, unspecified: Secondary | ICD-10-CM | POA: Diagnosis not present

## 2015-06-30 LAB — POCT RAPID STREP A (OFFICE): Rapid Strep A Screen: NEGATIVE

## 2015-06-30 MED ORDER — AZITHROMYCIN 250 MG PO TABS: ORAL_TABLET | ORAL | Status: DC

## 2015-06-30 NOTE — Patient Instructions (Addendum)
Sinusitis, Adult Sinusitis is redness, soreness, and inflammation of the paranasal sinuses. Paranasal sinuses are air pockets within the bones of your face. They are located beneath your eyes, in the middle of your forehead, and above your eyes. In healthy paranasal sinuses, mucus is able to drain out, and air is able to circulate through them by way of your nose. However, when your paranasal sinuses are inflamed, mucus and air can become trapped. This can allow bacteria and other germs to grow and cause infection. Sinusitis can develop quickly and last only a short time (acute) or continue over a long period (chronic). Sinusitis that lasts for more than 12 weeks is considered chronic. CAUSES Causes of sinusitis include:  Allergies.  Structural abnormalities, such as displacement of the cartilage that separates your nostrils (deviated septum), which can decrease the air flow through your nose and sinuses and affect sinus drainage.  Functional abnormalities, such as when the small hairs (cilia) that line your sinuses and help remove mucus do not work properly or are not present. SIGNS AND SYMPTOMS Symptoms of acute and chronic sinusitis are the same. The primary symptoms are pain and pressure around the affected sinuses. Other symptoms include:  Upper toothache.  Earache.  Headache.  Bad breath.  Decreased sense of smell and taste.  A cough, which worsens when you are lying flat.  Fatigue.  Fever.  Thick drainage from your nose, which often is green and may contain pus (purulent).  Swelling and warmth over the affected sinuses. DIAGNOSIS Your health care provider will perform a physical exam. During your exam, your health care provider may perform any of the following to help determine if you have acute sinusitis or chronic sinusitis:  Look in your nose for signs of abnormal growths in your nostrils (nasal polyps).  Tap over the affected sinus to check for signs of  infection.  View the inside of your sinuses using an imaging device that has a light attached (endoscope). If your health care provider suspects that you have chronic sinusitis, one or more of the following tests may be recommended:  Allergy tests.  Nasal culture. A sample of mucus is taken from your nose, sent to a lab, and screened for bacteria.  Nasal cytology. A sample of mucus is taken from your nose and examined by your health care provider to determine if your sinusitis is related to an allergy. TREATMENT Most cases of acute sinusitis are related to a viral infection and will resolve on their own within 10 days. Sometimes, medicines are prescribed to help relieve symptoms of both acute and chronic sinusitis. These may include pain medicines, decongestants, nasal steroid sprays, or saline sprays. However, for sinusitis related to a bacterial infection, your health care provider will prescribe antibiotic medicines. These are medicines that will help kill the bacteria causing the infection. Rarely, sinusitis is caused by a fungal infection. In these cases, your health care provider will prescribe antifungal medicine. For some cases of chronic sinusitis, surgery is needed. Generally, these are cases in which sinusitis recurs more than 3 times per year, despite other treatments. HOME CARE INSTRUCTIONS  Drink plenty of water. Water helps thin the mucus so your sinuses can drain more easily.  Use a humidifier.  Inhale steam 3-4 times a day (for example, sit in the bathroom with the shower running).  Apply a warm, moist washcloth to your face 3-4 times a day, or as directed by your health care provider.  Use saline nasal sprays to help   moisten and clean your sinuses.  Take medicines only as directed by your health care provider.  If you were prescribed either an antibiotic or antifungal medicine, finish it all even if you start to feel better. SEEK IMMEDIATE MEDICAL CARE IF:  You have  increasing pain or severe headaches.  You have nausea, vomiting, or drowsiness.  You have swelling around your face.  You have vision problems.  You have a stiff neck.  You have difficulty breathing.   This information is not intended to replace advice given to you by your health care provider. Make sure you discuss any questions you have with your health care provider.   Document Released: 07/12/2005 Document Revised: 08/02/2014 Document Reviewed: 07/27/2011 Elsevier Interactive Patient Education 2016 [employer]   Rest, mucinex, flonase, nasal saline, antibiotic prescribed  Strep test negative

## 2015-06-30 NOTE — Progress Notes (Signed)
   Subjective:    Patient ID: Kimberly Fuentes, female    DOB: 03/07/1996, 19 y.o.   MRN: [id number]  HPI  Sore throat: Patient presents for an acute office visit for a 2 day history of scratchy sore throat. She reports sinus pressure and headache for the past 3-5 days. She reports her boyfriend has had a sore throat for over a week. She denies any fevers, chills, nausea, vomit, diarrhea or rash. She endorses nasal congestion, runny nose, headache, sinus pressure and sore throat. She is eating and drinking well. She did receive her flu shot this year. She has not tried any medications.  Never smoker  Past Medical History  Diagnosis Date  . UTI (lower urinary tract infection)   . Allergy    Allergies  Allergen Reactions  . Cefazolin Hives  . Penicillins Hives  . Hydrocodone Itching    Facial itching when taking after wisdom teeth extraction    Review of Systems Negative, with the exception of above mentioned in HPI     Objective:   Physical Exam BP 120/81 mmHg  Pulse 89  Temp(Src) 98.7 F (37.1 C) (Oral)  Resp 20  Wt 135 lb 4 oz (61.349 kg)  SpO2 97%  LMP 06/16/2015 Gen: Afebrile. No acute distress. Nontoxic in appearance, well-developed, well-nourished, Caucasian female. Very pleasant. HENT: AT. [place]. Bilateral TM visualized shiny/full in appearance. MMM, no oral lesions. Bilateral nares with severe erythema, and moderate swelling. Throat with moderate erythema, no exudates. Cough present on exam, mild hoarseness present on exam. Tender to palpation bilateral maxillary sinuses. Eyes:Pupils Equal Round Reactive to light, Extraocular movements intact,  Conjunctiva without redness, discharge or icterus. Neck/lymp/endocrine: Supple, mild anterior cervical lymphadenopathy CV: RRR  Chest: CTAB, no wheeze or crackles Skin: No rashes, purpura or petechiae.      Assessment & Plan:  1. Sore throat - POCT rapid strep A--> negative - OTC therapy encouraged for comfort.   2.  Acute frontal sinusitis, recurrence not specified - Mucinex, floanse, rest, hydrate. Z-pack.  Work excuse today. - azithromycin (ZITHROMAX Z-PAK) 250 MG tablet; 500 mg day 1, then 250 mg QD  Dispense: 6 each; Refill: 0  F/U 1 week if no improvement.

## 2015-07-09 ENCOUNTER — Ambulatory Visit (INDEPENDENT_AMBULATORY_CARE_PROVIDER_SITE_OTHER): Payer: 59 | Admitting: Family Medicine

## 2015-07-09 ENCOUNTER — Encounter: Payer: Self-pay | Admitting: Family Medicine

## 2015-07-09 DIAGNOSIS — J0110 Acute frontal sinusitis, unspecified: Secondary | ICD-10-CM

## 2015-07-09 MED ORDER — AZITHROMYCIN 250 MG PO TABS: ORAL_TABLET | ORAL | Status: DC

## 2015-07-09 MED ORDER — PREDNISONE 50 MG PO TABS: 50.0000 mg | ORAL_TABLET | Freq: Every day | ORAL | Status: DC

## 2015-07-09 NOTE — Patient Instructions (Signed)
Continue flonase, zyrtec.  Take zpack and prednisone as prescribed.  Continue mucinex until nasal drip stops.  Rest. Hydrate.

## 2015-07-09 NOTE — Progress Notes (Signed)
   Subjective:    Patient ID: Kimberly Fuentes, female    DOB: 1995-09-09, 19 y.o.   MRN: [id number]  HPI  Sore throat: Patient presents for an acute office visit after persistent symptoms of sinus congestion after treatment. Patient states she had resolution of some of her symptoms but she continues to feel congested and with increase drainage in her throat. She has had mild daily drontal headaches, which she is taking ibuprofen. She has recently stopped the flonase and mucinex use. She denies nausea, vomit, diarrhea or rash. She continues to have a good diet. She finished her zpack 5 days ago.  Nonsmoker Past Medical History  Diagnosis Date  . UTI (lower urinary tract infection)   . Allergy    Allergies  Allergen Reactions  . Cefazolin Hives  . Penicillins Hives  . Hydrocodone Itching    Facial itching when taking after wisdom teeth extraction    Review of Systems Negative, with the exception of above mentioned in HPI    Objective:   Physical Exam BP 121/83 mmHg  Pulse 66  Temp(Src) 99.3 F (37.4 C)  Resp 20  Wt 136 lb 12 oz (62.029 kg)  SpO2 98%  LMP 06/16/2015 Gen: Afebrile. No acute distress. Nontoxic in appear HENT: AT. [place]. Bilateral TM visualized and normal in appearance. MMM, no oral lesions. Bilateral nares with erythema, no swelling. Throat without erythema or exudates. No cough, no ttp sinus. Mild hoarseness on exam.  Eyes:Pupils Equal Round Reactive to light, Extraocular movements intact,  Conjunctiva without redness, discharge or icterus. Neck/lymp/endocrine: Supple,mild ant cervical lymphadenopathy CV: RRR  Chest: CTAB, no wheeze or crackles    Assessment & Plan:  1. Acute frontal sinusitis, recurrence not specified - rpt zpack, prednisone added.  - azithromycin (ZITHROMAX Z-PAK) 250 MG tablet; Take as directed  Dispense: 6 each; Refill: 0 - predniSONE (DELTASONE) 50 MG tablet; Take 1 tablet (50 mg total) by mouth daily with breakfast.  Dispense: 5  tablet; Refill: 0 - Continue flonase, zyrtec. Take zpack and prednisone as prescribed.  Continue mucinex until nasal drip stops. Rest. Hydrate.

## 2015-07-16 ENCOUNTER — Ambulatory Visit: Payer: 59

## 2015-07-17 ENCOUNTER — Ambulatory Visit: Payer: 59

## 2015-07-22 ENCOUNTER — Ambulatory Visit (INDEPENDENT_AMBULATORY_CARE_PROVIDER_SITE_OTHER): Payer: 59 | Admitting: Family Medicine

## 2015-07-22 DIAGNOSIS — Z309 Encounter for contraceptive management, unspecified: Secondary | ICD-10-CM | POA: Diagnosis not present

## 2015-07-22 MED ORDER — MEDROXYPROGESTERONE ACETATE 150 MG/ML IM SUSP
150.0000 mg | Freq: Once | INTRAMUSCULAR | Status: AC
  Administered 2015-07-22: 150 mg via INTRAMUSCULAR

## 2015-10-08 ENCOUNTER — Ambulatory Visit (INDEPENDENT_AMBULATORY_CARE_PROVIDER_SITE_OTHER): Payer: Self-pay | Admitting: *Deleted

## 2015-10-08 DIAGNOSIS — Z3042 Encounter for surveillance of injectable contraceptive: Secondary | ICD-10-CM

## 2015-10-08 MED ORDER — MEDROXYPROGESTERONE ACETATE 150 MG/ML IM SUSP
150.0000 mg | Freq: Once | INTRAMUSCULAR | Status: AC
  Administered 2015-10-08: 150 mg via INTRAMUSCULAR

## 2015-11-10 ENCOUNTER — Ambulatory Visit: Payer: 59

## 2015-11-10 ENCOUNTER — Ambulatory Visit (INDEPENDENT_AMBULATORY_CARE_PROVIDER_SITE_OTHER): Payer: Self-pay | Admitting: *Deleted

## 2015-11-10 DIAGNOSIS — Z111 Encounter for screening for respiratory tuberculosis: Secondary | ICD-10-CM

## 2015-11-10 NOTE — Progress Notes (Signed)
Patient presents today for PPD test for school. PPD placed in left forearm. Patient to come in on Wednesday 11/12/15 after 11:00 am for reading.

## 2015-11-12 ENCOUNTER — Ambulatory Visit: Payer: Self-pay | Admitting: *Deleted

## 2015-11-12 DIAGNOSIS — Z111 Encounter for screening for respiratory tuberculosis: Secondary | ICD-10-CM

## 2015-11-12 LAB — TB SKIN TEST
Induration: 0 mm
TB Skin Test: NEGATIVE

## 2015-11-12 NOTE — Progress Notes (Signed)
Patient presents today for read of PPD. Test is negative.

## 2016-01-09 ENCOUNTER — Ambulatory Visit (INDEPENDENT_AMBULATORY_CARE_PROVIDER_SITE_OTHER): Payer: Self-pay | Admitting: *Deleted

## 2016-01-09 DIAGNOSIS — Z309 Encounter for contraceptive management, unspecified: Secondary | ICD-10-CM

## 2016-01-09 MED ORDER — MEDROXYPROGESTERONE ACETATE 150 MG/ML IM SUSP
150.0000 mg | Freq: Once | INTRAMUSCULAR | Status: AC
  Administered 2016-01-09: 150 mg via INTRAMUSCULAR

## 2016-01-09 NOTE — Progress Notes (Signed)
Patient present today for depo-provera injection. Patient tolerated injection well. Next injection in 3 months.

## 2016-02-17 ENCOUNTER — Telehealth: Payer: Self-pay | Admitting: Family Medicine

## 2016-02-17 NOTE — Telephone Encounter (Signed)
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 Please call her when they are ready to pick up.

## 2016-02-18 NOTE — Telephone Encounter (Signed)
Record printed and placed up front for pick up. Left message for patient.

## 2016-04-14 ENCOUNTER — Other Ambulatory Visit (HOSPITAL_COMMUNITY)
Admission: RE | Admit: 2016-04-14 | Discharge: 2016-04-14 | Disposition: A | Discharge status: Home / Self Care | Payer: No Typology Code available for payment source | Source: Ambulatory Visit | Attending: Family Medicine | Admitting: Family Medicine

## 2016-04-14 ENCOUNTER — Encounter: Payer: Self-pay | Admitting: Family Medicine

## 2016-04-14 ENCOUNTER — Ambulatory Visit (INDEPENDENT_AMBULATORY_CARE_PROVIDER_SITE_OTHER): Payer: PRIVATE HEALTH INSURANCE | Admitting: Family Medicine

## 2016-04-14 VITALS — BP 126/83 | HR 75 | Temp 98.6°F | Resp 20 | Ht 62.0 in | Wt 159.2 lb

## 2016-04-14 DIAGNOSIS — Z0000 Encounter for general adult medical examination without abnormal findings: Secondary | ICD-10-CM | POA: Diagnosis not present

## 2016-04-14 DIAGNOSIS — Z111 Encounter for screening for respiratory tuberculosis: Secondary | ICD-10-CM | POA: Diagnosis not present

## 2016-04-14 DIAGNOSIS — Z0184 Encounter for antibody response examination: Secondary | ICD-10-CM | POA: Diagnosis not present

## 2016-04-14 DIAGNOSIS — Z113 Encounter for screening for infections with a predominantly sexual mode of transmission: Secondary | ICD-10-CM | POA: Insufficient documentation

## 2016-04-14 DIAGNOSIS — Z23 Encounter for immunization: Secondary | ICD-10-CM | POA: Diagnosis not present

## 2016-04-14 NOTE — Progress Notes (Signed)
Patient ID: Kimberly Fuentes, female  DOB: May 20, 1996, 20 y.o.   MRN: [id number] Patient Care Team    Relationship Specialty Notifications Start End  Ma Hillock, DO PCP - General Family Medicine  03/27/15     Subjective:  Kimberly Fuentes is a 20 y.o.  female present for CPE and school form clearance.  All past medical history, surgical history, allergies, family history, immunizations, medications and social history were updated in the electronic medical record today. All recent labs, ED visits and hospitalizations within the last year were reviewed.  Pt has no complaints today. 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 As well as PPD screening (two step).   Health maintenance:  Colonoscopy: No family history, routine screening at age 67 Mammogram: No family history, routine screening at age 81-50 Cervical cancer screening: Not indicated, start at 109 with Pap smears Immunizations: Td due, flu vaccination indicated.  Infectious disease screening: Chlamydia screening  Needed Assistive device: none Oxygen use: none Patient has a Dental home. Hospitalizations/ED visits: none  Immunization History  Administered Date(s) Administered  . Influenza,inj,Quad PF,36+ Mos 05/01/2015, 04/14/2016  . PPD Test 11/10/2015, 04/14/2016  . Tdap 04/14/2016    Past Medical History:  Diagnosis Date  . Allergy   . UTI (lower urinary tract infection)    Allergies  Allergen Reactions  . Cefazolin Hives  . Penicillins Hives  . Hydrocodone Itching    Facial itching when taking after wisdom teeth extraction   Past Surgical History:  Procedure Laterality Date  . TONSILLECTOMY AND ADENOIDECTOMY  2000  . WISDOM TOOTH EXTRACTION  2012   Family History  Problem Relation Age of Onset  . Miscarriages / [place] Mother    Social History   Social History  . Marital status: Single    Spouse name: N/A  . Number of children: N/A  . Years of education: N/A    Occupational History  . Not on file.   Social History Main Topics  . Smoking status: Passive Smoke Exposure - Never Smoker  . Smokeless tobacco: Never Used  . Alcohol use No  . Drug use: No  . Sexual activity: Yes    Birth control/ protection: None, Condom, Injection     Comment: depo   Other Topics Concern  . Not on file   Social History Narrative   Wears seat belt, wears bike helmet. Feels safe in her relationships. Works at [employer].    Investment banker, corporate, applying for surgical tech   - [employer]   - No etoh, drugs or smoker.    - In a relationship   - Patient has a dental home, with every 6 month check ups.     Medication List    as of 04/14/2016  2:26 PM   You have not been prescribed any medications.      No results found for this or any previous visit (from the past 2160 hour(s)).  No results found.   ROS: 14 pt review of systems performed and negative (unless mentioned in an HPI)  Objective: BP 126/83 (BP Location: Right Arm, Patient Position: Sitting, Cuff Size: Normal)   Pulse 75   Temp 98.6 F (37 C)   Resp 20   Ht 5' 2"  (1.575 m)   Wt 159 lb 4 oz (72.2 kg)   SpO2 98%   BMI 29.13 kg/m  Gen: Afebrile. No acute distress. Nontoxic in appearance, well-developed, well-nourished,  Pleasant caucasian female HENT:  AT. [place]. Bilateral TM visualized and normal in appearance, normal external auditory canal. MMM, no oral lesions, adequate dentition. Bilateral nares within normal limits. Throat without erythema, ulcerations or exudates. no Cough on exam, no hoarseness on exam. Eyes:Pupils Equal Round Reactive to light, Extraocular movements intact,  Conjunctiva without redness, discharge or icterus. Neck/lymp/endocrine: Supple,no lymphadenopathy, no thyromegaly CV: RRR no murmur, no edema, +2/4 P posterior tibialis pulses.  Chest: CTAB, no wheeze, rhonchi or crackles. normal Respiratory effort. no Air movement. Abd: Soft. Mildly obese. NTND. BS present. no Masses  palpated. No hepatosplenomegaly. No rebound tenderness or guarding. Skin: no rashes, purpura or petechiae. Warm and well-perfused. Skin intact. Neuro/Msk:  Normal gait. PERLA. EOMi. Alert. Oriented x3.  Cranial nerves II through XII intact. Muscle strength 5/5 upper/lower extremity. DTRs equal bilaterally. Psych: Normal affect, dress and demeanor. Normal speech. Normal thought content and judgment.  Assessment/plan: Kimberly Fuentes is a 20 y.o. female present for CPE, college physical.  Encounter for preventive health examination Patient was encouraged to exercise greater than 150 minutes a week. Patient was encouraged to choose a diet filled with fresh fruits and vegetables, and lean meats. AVS provided to patient today for education/recommendation on gender specific health and safety maintenance. Immunity status testing - Measles/Mumps/Rubella Immunity - Varicella zoster antibody, IgG Tuberculosis screening - PPD Screen for STD (sexually transmitted disease) - Urine cytology ancillary only Influenza vaccine administered - Flu Vaccine QUAD 36+ mos PF IM (Fluarix & Fluzone Quad PF) Need for diphtheria-tetanus-pertussis (Tdap) vaccine - Tdap vaccine greater than or equal to 7yo IM Tuberculosis screening - PPD  Return in about 1 year (around 04/14/2017) for CPE.  Electronically signed by: Howard Pouch, DO [place]

## 2016-04-14 NOTE — Patient Instructions (Signed)
Today you received your flu shot, tdap and PPD screen.  Return as scheduled for you PPD reading.  We will collect titers for varicella and MMR and call you with all results once available.    Health Maintenance, Female Adopting a healthy lifestyle and getting preventive care can go a long way to promote health and wellness. Talk with your health care provider about what schedule of regular examinations is right for you. This is a good chance for you to check in with your provider about disease prevention and staying healthy. In between checkups, there are plenty of things you can do on your own. Experts have done a lot of research about which lifestyle changes and preventive measures are most likely to keep you healthy. Ask your health care provider for more information. WEIGHT AND DIET  Eat a healthy diet  Be sure to include plenty of vegetables, fruits, low-fat dairy products, and lean protein.  Do not eat a lot of foods high in solid fats, added sugars, or salt.  Get regular exercise. This is one of the most important things you can do for your health.  Most adults should exercise for at least 150 minutes each week. The exercise should increase your heart rate and make you sweat (moderate-intensity exercise).  Most adults should also do strengthening exercises at least twice a week. This is in addition to the moderate-intensity exercise.  Maintain a healthy weight  Body mass index (BMI) is a measurement that can be used to identify possible weight problems. It estimates body fat based on height and weight. Your health care provider can help determine your BMI and help you achieve or maintain a healthy weight.  For females 73 years of age and older:   A BMI below 18.5 is considered underweight.  A BMI of 18.5 to 24.9 is normal.  A BMI of 25 to 29.9 is considered overweight.  A BMI of 30 and above is considered obese.  Watch levels of cholesterol and blood lipids  You should  start having your blood tested for lipids and cholesterol at 21 years of age, then have this test every 5 years.  You may need to have your cholesterol levels checked more often if:  Your lipid or cholesterol levels are high.  You are older than 21 years of age.  You are at high risk for heart disease.  CANCER SCREENING   Lung Cancer  Lung cancer screening is recommended for adults 90-32 years old who are at high risk for lung cancer because of a history of smoking.  A yearly low-dose CT scan of the lungs is recommended for people who:  Currently smoke.  Have quit within the past 15 years.  Have at least a 30-pack-year history of smoking. A pack year is smoking an average of one pack of cigarettes a day for 1 year.  Yearly screening should continue until it has been 15 years since you quit.  Yearly screening should stop if you develop a health problem that would prevent you from having lung cancer treatment.  Breast Cancer  Practice breast self-awareness. This means understanding how your breasts normally appear and feel.  It also means doing regular breast self-exams. Let your health care provider know about any changes, no matter how small.  If you are in your 20s or 30s, you should have a clinical breast exam (CBE) by a health care provider every 1-3 years as part of a regular health exam.  If you are 40  or older, have a CBE every year. Also consider having a breast X-ray (mammogram) every year.  If you have a family history of breast cancer, talk to your health care provider about genetic screening.  If you are at high risk for breast cancer, talk to your health care provider about having an MRI and a mammogram every year.  Breast cancer gene (BRCA) assessment is recommended for women who have family members with BRCA-related cancers. BRCA-related cancers include:  Breast.  Ovarian.  Tubal.  Peritoneal cancers.  Results of the assessment will determine the  need for genetic counseling and BRCA1 and BRCA2 testing. Cervical Cancer Your health care provider may recommend that you be screened regularly for cancer of the pelvic organs (ovaries, uterus, and vagina). This screening involves a pelvic examination, including checking for microscopic changes to the surface of your cervix (Pap test). You may be encouraged to have this screening done every 3 years, beginning at age 21.  For women ages 30-65, health care providers may recommend pelvic exams and Pap testing every 3 years, or they may recommend the Pap and pelvic exam, combined with testing for human papilloma virus (HPV), every 5 years. Some types of HPV increase your risk of cervical cancer. Testing for HPV may also be done on women of any age with unclear Pap test results.  Other health care providers may not recommend any screening for nonpregnant women who are considered low risk for pelvic cancer and who do not have symptoms. Ask your health care provider if a screening pelvic exam is right for you.  If you have had past treatment for cervical cancer or a condition that could lead to cancer, you need Pap tests and screening for cancer for at least 20 years after your treatment. If Pap tests have been discontinued, your risk factors (such as having a new sexual partner) need to be reassessed to determine if screening should resume. Some women have medical problems that increase the chance of getting cervical cancer. In these cases, your health care provider may recommend more frequent screening and Pap tests. Colorectal Cancer  This type of cancer can be detected and often prevented.  Routine colorectal cancer screening usually begins at 21 years of age and continues through 21 years of age.  Your health care provider may recommend screening at an earlier age if you have risk factors for colon cancer.  Your health care provider may also recommend using home test kits to check for hidden blood in  the stool.  A small camera at the end of a tube can be used to examine your colon directly (sigmoidoscopy or colonoscopy). This is done to check for the earliest forms of colorectal cancer.  Routine screening usually begins at age 50.  Direct examination of the colon should be repeated every 5-10 years through 21 years of age. However, you may need to be screened more often if early forms of precancerous polyps or small growths are found. Skin Cancer  Check your skin from head to toe regularly.  Tell your health care provider about any new moles or changes in moles, especially if there is a change in a mole's shape or color.  Also tell your health care provider if you have a mole that is larger than the size of a pencil eraser.  Always use sunscreen. Apply sunscreen liberally and repeatedly throughout the day.  Protect yourself by wearing long sleeves, pants, a wide-brimmed hat, and sunglasses whenever you are outside. HEART DISEASE,   DIABETES, AND HIGH BLOOD PRESSURE   High blood pressure causes heart disease and increases the risk of stroke. High blood pressure is more likely to develop in:  People who have blood pressure in the high end of the normal range (130-139/85-89 mm Hg).  People who are overweight or obese.  People who are African American.  If you are 18-39 years of age, have your blood pressure checked every 3-5 years. If you are 40 years of age or older, have your blood pressure checked every year. You should have your blood pressure measured twice--once when you are at a hospital or clinic, and once when you are not at a hospital or clinic. Record the average of the two measurements. To check your blood pressure when you are not at a hospital or clinic, you can use:  An automated blood pressure machine at a pharmacy.  A home blood pressure monitor.  If you are between 55 years and 79 years old, ask your health care provider if you should take aspirin to prevent  strokes.  Have regular diabetes screenings. This involves taking a blood sample to check your fasting blood sugar level.  If you are at a normal weight and have a low risk for diabetes, have this test once every three years after 21 years of age.  If you are overweight and have a high risk for diabetes, consider being tested at a younger age or more often. PREVENTING INFECTION  Hepatitis B  If you have a higher risk for hepatitis B, you should be screened for this virus. You are considered at high risk for hepatitis B if:  You were born in a country where hepatitis B is common. Ask your health care provider which countries are considered high risk.  Your parents were born in a high-risk country, and you have not been immunized against hepatitis B (hepatitis B vaccine).  You have HIV or AIDS.  You use needles to inject street drugs.  You live with someone who has hepatitis B.  You have had sex with someone who has hepatitis B.  You get hemodialysis treatment.  You take certain medicines for conditions, including cancer, organ transplantation, and autoimmune conditions. Hepatitis C  Blood testing is recommended for:  Everyone born from 1945 through 1965.  Anyone with known risk factors for hepatitis C. Sexually transmitted infections (STIs)  You should be screened for sexually transmitted infections (STIs) including gonorrhea and chlamydia if:  You are sexually active and are younger than 21 years of age.  You are older than 21 years of age and your health care provider tells you that you are at risk for this type of infection.  Your sexual activity has changed since you were last screened and you are at an increased risk for chlamydia or gonorrhea. Ask your health care provider if you are at risk.  If you do not have HIV, but are at risk, it may be recommended that you take a prescription medicine daily to prevent HIV infection. This is called pre-exposure prophylaxis  (PrEP). You are considered at risk if:  You are sexually active and do not regularly use condoms or know the HIV status of your partner(s).  You take drugs by injection.  You are sexually active with a partner who has HIV. Talk with your health care provider about whether you are at high risk of being infected with HIV. If you choose to begin PrEP, you should first be tested for HIV. You should then   be tested every 3 months for as long as you are taking PrEP.  PREGNANCY   If you are premenopausal and you may become pregnant, ask your health care provider about preconception counseling.  If you may become pregnant, take 400 to 800 micrograms (mcg) of folic acid every day.  If you want to prevent pregnancy, talk to your health care provider about birth control (contraception). OSTEOPOROSIS AND MENOPAUSE   Osteoporosis is a disease in which the bones lose minerals and strength with aging. This can result in serious bone fractures. Your risk for osteoporosis can be identified using a bone density scan.  If you are 78 years of age or older, or if you are at risk for osteoporosis and fractures, ask your health care provider if you should be screened.  Ask your health care provider whether you should take a calcium or vitamin D supplement to lower your risk for osteoporosis.  Menopause may have certain physical symptoms and risks.  Hormone replacement therapy may reduce some of these symptoms and risks. Talk to your health care provider about whether hormone replacement therapy is right for you.  HOME CARE INSTRUCTIONS   Schedule regular health, dental, and eye exams.  Stay current with your immunizations.   Do not use any tobacco products including cigarettes, chewing tobacco, or electronic cigarettes.  If you are pregnant, do not drink alcohol.  If you are breastfeeding, limit how much and how often you drink alcohol.  Limit alcohol intake to no more than 1 drink per day for  nonpregnant women. One drink equals 12 ounces of beer, 5 ounces of wine, or 1 ounces of hard liquor.  Do not use street drugs.  Do not share needles.  Ask your health care provider for help if you need support or information about quitting drugs.  Tell your health care provider if you often feel depressed.  Tell your health care provider if you have ever been abused or do not feel safe at home.   This information is not intended to replace advice given to you by your health care provider. Make sure you discuss any questions you have with your health care provider.   Document Released: 01/25/2011 Document Revised: 08/02/2014 Document Reviewed: 06/13/2013 Elsevier Interactive Patient Education [employer].

## 2016-04-15 ENCOUNTER — Telehealth: Payer: Self-pay | Admitting: Family Medicine

## 2016-04-15 LAB — URINE CYTOLOGY ANCILLARY ONLY
CHLAMYDIA, DNA PROBE: NEGATIVE
NEISSERIA GONORRHEA: NEGATIVE

## 2016-04-15 LAB — MEASLES/MUMPS/RUBELLA IMMUNITY
Mumps IgG: 163 AU/mL — ABNORMAL HIGH (ref <9.00)
RUBELLA: 21.3 {index} — AB (ref <0.90)
Rubeola IgG: >300 AU/mL — ABNORMAL HIGH (ref <25.00)

## 2016-04-15 LAB — VARICELLA ZOSTER ANTIBODY, IGG: Varicella IgG: 394.4 Index — ABNORMAL HIGH (ref <135.00)

## 2016-04-15 NOTE — Telephone Encounter (Signed)
Please call pt: - all her labs appear normal and she is immune to MMR and varicella.  - She can obtain a copy of these for her school files with her other documents when she has her PPD reading.

## 2016-04-16 ENCOUNTER — Ambulatory Visit: Payer: PRIVATE HEALTH INSURANCE

## 2016-04-16 DIAGNOSIS — Z111 Encounter for screening for respiratory tuberculosis: Secondary | ICD-10-CM

## 2016-04-16 LAB — TB SKIN TEST
Induration: 0 mm
TB Skin Test: NEGATIVE

## 2016-04-16 NOTE — Telephone Encounter (Signed)
Spoke with patient reviewed results. 

## 2016-04-21 ENCOUNTER — Ambulatory Visit (INDEPENDENT_AMBULATORY_CARE_PROVIDER_SITE_OTHER): Payer: PRIVATE HEALTH INSURANCE | Admitting: *Deleted

## 2016-04-21 DIAGNOSIS — Z111 Encounter for screening for respiratory tuberculosis: Secondary | ICD-10-CM

## 2016-04-21 NOTE — Progress Notes (Signed)
Patient ID: Kimberly Fuentes, female   DOB: 1996-05-26, 20 y.o.   MRN: [id number] Patient presents today for second PPD screen as required by her school. PPD placed in right forearm.

## 2016-04-23 ENCOUNTER — Ambulatory Visit: Payer: PRIVATE HEALTH INSURANCE | Admitting: *Deleted

## 2016-04-23 DIAGNOSIS — Z111 Encounter for screening for respiratory tuberculosis: Secondary | ICD-10-CM

## 2016-04-23 LAB — TB SKIN TEST
INDURATION: 0 mm
TB Skin Test: NEGATIVE

## 2016-04-23 NOTE — Progress Notes (Signed)
Patient PPD test #2 negative. Vision screen and hearing whisper test performed. Patient given original copies of her school physical form.

## 2016-07-22 ENCOUNTER — Ambulatory Visit (INDEPENDENT_AMBULATORY_CARE_PROVIDER_SITE_OTHER): Payer: 59 | Admitting: Family Medicine

## 2016-07-22 ENCOUNTER — Encounter: Payer: Self-pay | Admitting: Family Medicine

## 2016-07-22 VITALS — BP 107/62 | HR 78 | Temp 98.2°F | Resp 18 | Wt 162.0 lb

## 2016-07-22 DIAGNOSIS — J0110 Acute frontal sinusitis, unspecified: Secondary | ICD-10-CM | POA: Diagnosis not present

## 2016-07-22 LAB — POC INFLUENZA A&B (BINAX/QUICKVUE)
INFLUENZA A, POC: NEGATIVE
Influenza B, POC: NEGATIVE

## 2016-07-22 MED ORDER — PREDNISONE 50 MG PO TABS: 50.0000 mg | ORAL_TABLET | Freq: Every day | ORAL | 0 refills | Status: DC

## 2016-07-22 MED ORDER — AZITHROMYCIN 250 MG PO TABS: ORAL_TABLET | ORAL | 0 refills | Status: DC

## 2016-07-22 NOTE — Patient Instructions (Signed)
-   influenza negative today. This is a virus.  - you can start prednisone for 5 days with food to help with inflammatory symptoms.   - flonase, plain mucinex, advil/tylenol, rest and HYDRATE.  -  z-pack printed, start on Saturday if not improving or worsening.

## 2016-07-22 NOTE — Progress Notes (Addendum)
Eual FinesBrittany P Wangerin , 03-Dec-1995, 20 y.o., female MRN: [id number] Patient Care Team    Relationship Specialty Notifications Start End  Natalia Leatherwoodenee A Deloss Amico, DO PCP - General Family Medicine  03/27/15     CC: URI Subjective: Pt presents for an acute OV with complaints of URI of 2 days duration.  Associated symptoms include cough, congestion, nausea, chills, headache. She denies vomit, diarrhea or abd pain. Pt has tried Alka-Seltzer for sinus to ease their symptoms. She has received her flu shot this year.  Allergies  Allergen Reactions  . Cefazolin Hives  . Penicillins Hives  . Hydrocodone Itching    Facial itching when taking after wisdom teeth extraction   Social History  Substance Use Topics  . Smoking status: Passive Smoke Exposure - Never Smoker  . Smokeless tobacco: Never Used  . Alcohol use No   Past Medical History:  Diagnosis Date  . Allergy   . UTI (lower urinary tract infection)    Past Surgical History:  Procedure Laterality Date  . TONSILLECTOMY AND ADENOIDECTOMY  2000  . WISDOM TOOTH EXTRACTION  2012   Family History  Problem Relation Age of Onset  . Miscarriages / Stillbirths Mother    Allergies as of 07/22/2016      Reactions   Cefazolin Hives   Penicillins Hives   Hydrocodone Itching   Facial itching when taking after wisdom teeth extraction      Medication List       Accurate as of 07/22/16  2:24 PM. Always use your most recent med list.          azithromycin 250 MG tablet Commonly known as:  ZITHROMAX Z-PAK 500 mg day 1, then 250 mg QD.   predniSONE 50 MG tablet Commonly known as:  DELTASONE Take 1 tablet (50 mg total) by mouth daily with breakfast.       Results for orders placed or performed in visit on 07/22/16 (from the past 24 hour(s))  POC Influenza A&B (Binax test)     Status: Normal   Collection Time: 07/22/16  2:10 PM  Result Value Ref Range   Influenza A, POC Negative Negative   Influenza B, POC Negative Negative   No  results found.   ROS: Negative, with the exception of above mentioned in HPI   Objective:  BP 107/62 (BP Location: Left Arm, Patient Position: Sitting, Cuff Size: Normal)   Pulse 78   Temp 98.2 F (36.8 C)   Resp 18   Wt 162 lb (73.5 kg)   LMP 07/08/2016   SpO2 98%   BMI 29.63 kg/m  Body mass index is 29.63 kg/m. Gen: Afebrile. No acute distress. Nontoxic in appearance, well developed, well nourished. Appears fatigued. He replies in Caucasian female. HENT: AT. [place]. Bilateral TM visualized bilateral air-fluid levels. MMM, no oral lesions. Bilateral nares erythema and drainage. Throat without erythema or exudates. Postnasal drip. No hoarseness. Mild cough. No tenderness to palpation maxillary sinus. Eyes:Pupils Equal Round Reactive to light, Extraocular movements intact,  Conjunctiva without redness, discharge or icterus. Neck/lymp/endocrine: Supple, no lymphadenopathy CV: RRR  Chest: CTAB, no wheeze or crackles. Good air movement, normal resp effort.  Neuro: Normal gait. PERLA. EOMi. Alert. Oriented x3   Results for orders placed or performed in visit on 07/22/16 (from the past 24 hour(s))  POC Influenza A&B (Binax test)     Status: Normal   Collection Time: 07/22/16  2:10 PM  Result Value Ref Range   Influenza A, POC Negative Negative  Influenza B, POC Negative Negative    Assessment/Plan: [place]adaBrittany P Stein is a 20 y.o. female present for acute OV for  Acute frontal sinusitis, recurrence not specified - Patient's influenza was negative today. Discussed likely viral illness causing symptoms. Patient to treat with over-the-counter therapies. Will provide printed prescription of antibiotic and she is instructed not to start or fill antibiotic unless she is worsening in symptoms/not improving on Saturday. Patient voiced understanding of plan. - Start prednisone burst today. - flonase, plain mucinex, advil/tylenol, rest and HYDRATE.  - Follow-up when necessary, 1-2 weeks if  worsening.   electronically signed by:  Felix Pacinienee Mandela Bello, DO  [facility] [place]

## 2016-07-22 NOTE — Addendum Note (Signed)
Addended by: Thomasena EdisWILLIAMS, [place] N on: 07/22/2016 02:11 PM   Modules accepted: Orders

## 2016-09-10 DIAGNOSIS — Z6828 Body mass index (BMI) 28.0-28.9, adult: Secondary | ICD-10-CM | POA: Diagnosis not present

## 2016-09-10 DIAGNOSIS — Z113 Encounter for screening for infections with a predominantly sexual mode of transmission: Secondary | ICD-10-CM | POA: Diagnosis not present

## 2016-09-10 DIAGNOSIS — N390 Urinary tract infection, site not specified: Secondary | ICD-10-CM | POA: Diagnosis not present

## 2016-09-10 DIAGNOSIS — Z01419 Encounter for gynecological examination (general) (routine) without abnormal findings: Secondary | ICD-10-CM | POA: Diagnosis not present

## 2016-09-10 DIAGNOSIS — N760 Acute vaginitis: Secondary | ICD-10-CM | POA: Diagnosis not present

## 2016-09-24 DIAGNOSIS — F633 Trichotillomania: Secondary | ICD-10-CM | POA: Diagnosis not present

## 2016-10-08 DIAGNOSIS — F633 Trichotillomania: Secondary | ICD-10-CM | POA: Diagnosis not present

## 2016-10-25 ENCOUNTER — Encounter: Payer: Self-pay | Admitting: Physician Assistant

## 2016-10-25 ENCOUNTER — Ambulatory Visit (INDEPENDENT_AMBULATORY_CARE_PROVIDER_SITE_OTHER): Payer: 59 | Admitting: Physician Assistant

## 2016-10-25 VITALS — BP 118/80 | HR 87 | Temp 98.5°F | Ht 62.0 in | Wt 159.0 lb

## 2016-10-25 DIAGNOSIS — J069 Acute upper respiratory infection, unspecified: Secondary | ICD-10-CM

## 2016-10-25 DIAGNOSIS — H6983 Other specified disorders of Eustachian tube, bilateral: Secondary | ICD-10-CM | POA: Diagnosis not present

## 2016-10-25 MED ORDER — DOXYCYCLINE HYCLATE 100 MG PO TABS: 100.0000 mg | ORAL_TABLET | Freq: Two times a day (BID) | ORAL | 0 refills | Status: DC

## 2016-10-25 MED ORDER — PREDNISONE 20 MG PO TABS: 20.0000 mg | ORAL_TABLET | Freq: Two times a day (BID) | ORAL | 0 refills | Status: AC

## 2016-10-25 NOTE — Patient Instructions (Signed)
Continue Flonase daily. Benadryl as needed.  You may begin the antibiotic if no improvement in a few days, but if you develop fever, worsening cough or sinus pressure, begin the antibiotic.  Start the steroid now, take with food.  Follow-up with PCP in 7-10 days if no improvement, sooner if needed.   Eustachian Tube Dysfunction The eustachian tube connects the middle ear to the back of the nose. It regulates air pressure in the middle ear by allowing air to move between the ear and nose. It also helps to drain fluid from the middle ear space. When the eustachian tube does not function properly, air pressure, fluid, or both can build up in the middle ear. Eustachian tube dysfunction can affect one or both ears. What are the causes? This condition happens when the eustachian tube becomes blocked or cannot open normally. This may result from:  Ear infections.  Colds and other upper respiratory infections.  Allergies.  Irritation, such as from cigarette smoke or acid from the stomach coming up into the esophagus (gastroesophageal reflux).  Sudden changes in air pressure, such as from descending in an airplane.  Abnormal growths in the nose or throat, such as nasal polyps, tumors, or enlarged tissue at the back of the throat (adenoids). What increases the risk? This condition may be more likely to develop in people who smoke and people who are overweight. Eustachian tube dysfunction may also be more likely to develop in children, especially children who have:  Certain birth defects of the mouth, such as cleft palate.  Large tonsils and adenoids. What are the signs or symptoms? Symptoms of this condition may include:  A feeling of fullness in the ear.  Ear pain.  Clicking or popping noises in the ear.  Ringing in the ear.  Hearing loss.  Loss of balance. Symptoms may get worse when the air pressure around you changes, such as when you travel to an area of high elevation or fly  on an airplane. How is this diagnosed? This condition may be diagnosed based on:  Your symptoms.  A physical exam of your ear, nose, and throat.  Tests, such as those that measure:  The movement of your eardrum (tympanogram).  Your hearing (audiometry). How is this treated? Treatment depends on the cause and severity of your condition. If your symptoms are mild, you may be able to relieve your symptoms by moving air into ("popping") your ears. If you have symptoms of fluid in your ears, treatment may include:  Decongestants.  Antihistamines.  Nasal sprays or ear drops that contain medicines that reduce swelling (steroids). In some cases, you may need to have a procedure to drain the fluid in your eardrum (myringotomy). In this procedure, a small tube is placed in the eardrum to:  Drain the fluid.  Restore the air in the middle ear space. Follow these instructions at home:  Take over-the-counter and prescription medicines only as told by your health care provider.  Use techniques to help pop your ears as recommended by your health care provider. These may include:  Chewing gum.  Yawning.  Frequent, forceful swallowing.  Closing your mouth, holding your nose closed, and gently blowing as if you are trying to blow air out of your nose.  Do not do any of the following until your health care provider approves:  Travel to high altitudes.  Fly in airplanes.  Work in a Estate agent or room.  Scuba dive.  Keep your ears dry. Dry your ears  completely after showering or bathing.  Do not smoke.  Keep all follow-up visits as told by your health care provider. This is important. Contact a health care provider if:  Your symptoms do not go away after treatment.  Your symptoms come back after treatment.  You are unable to pop your ears.  You have:  A fever.  Pain in your ear.  Pain in your head or neck.  Fluid draining from your ear.  Your hearing suddenly  changes.  You become very dizzy.  You lose your balance. This information is not intended to replace advice given to you by your health care provider. Make sure you discuss any questions you have with your health care provider. Document Released: 08/08/2015 Document Revised: 12/18/2015 Document Reviewed: 07/31/2014 Elsevier Interactive Patient Education  2017 [employer]  Upper Respiratory Infection, Adult Most upper respiratory infections (URIs) are a viral infection of the air passages leading to the lungs. A URI affects the nose, throat, and upper air passages. The most common type of URI is nasopharyngitis and is typically referred to as "the common cold." URIs run their course and usually go away on their own. Most of the time, a URI does not require medical attention, but sometimes a bacterial infection in the upper airways can follow a viral infection. This is called a secondary infection. Sinus and middle ear infections are common types of secondary upper respiratory infections. Bacterial pneumonia can also complicate a URI. A URI can worsen asthma and chronic obstructive pulmonary disease (COPD). Sometimes, these complications can require emergency medical care and may be life threatening. What are the causes? Almost all URIs are caused by viruses. A virus is a type of germ and can spread from one person to another. What increases the risk? You may be at risk for a URI if:  You smoke.  You have chronic heart or lung disease.  You have a weakened defense (immune) system.  You are very young or very old.  You have nasal allergies or asthma.  You work in crowded or poorly ventilated areas.  You work in health care facilities or schools. What are the signs or symptoms? Symptoms typically develop 2-3 days after you come in contact with a cold virus. Most viral URIs last 7-10 days. However, viral URIs from the influenza virus (flu virus) can last 14-18 days and are typically  more severe. Symptoms may include:  Runny or stuffy (congested) nose.  Sneezing.  Cough.  Sore throat.  Headache.  Fatigue.  Fever.  Loss of appetite.  Pain in your forehead, behind your eyes, and over your cheekbones (sinus pain).  Muscle aches. How is this diagnosed? Your health care provider may diagnose a URI by:  Physical exam.  Tests to check that your symptoms are not due to another condition such as:  Strep throat.  Sinusitis.  Pneumonia.  Asthma. How is this treated? A URI goes away on its own with time. It cannot be cured with medicines, but medicines may be prescribed or recommended to relieve symptoms. Medicines may help:  Reduce your fever.  Reduce your cough.  Relieve nasal congestion. Follow these instructions at home:  Take medicines only as directed by your health care provider.  Gargle warm saltwater or take cough drops to comfort your throat as directed by your health care provider.  Use a warm mist humidifier or inhale steam from a shower to increase air moisture. This may make it easier to breathe.  Drink enough fluid  to keep your urine clear or pale yellow.  Eat soups and other clear broths and maintain good nutrition.  Rest as needed.  Return to work when your temperature has returned to normal or as your health care provider advises. You may need to stay home longer to avoid infecting others. You can also use a face mask and careful hand washing to prevent spread of the virus.  Increase the usage of your inhaler if you have asthma.  Do not use any tobacco products, including cigarettes, chewing tobacco, or electronic cigarettes. If you need help quitting, ask your health care provider. How is this prevented? The best way to protect yourself from getting a cold is to practice good hygiene.  Avoid oral or hand contact with people with cold symptoms.  Wash your hands often if contact occurs. There is no clear evidence that  vitamin C, vitamin E, echinacea, or exercise reduces the chance of developing a cold. However, it is always recommended to get plenty of rest, exercise, and practice good nutrition. Contact a health care provider if:  You are getting worse rather than better.  Your symptoms are not controlled by medicine.  You have chills.  You have worsening shortness of breath.  You have brown or red mucus.  You have yellow or brown nasal discharge.  You have pain in your face, especially when you bend forward.  You have a fever.  You have swollen neck glands.  You have pain while swallowing.  You have white areas in the back of your throat. Get help right away if:  You have severe or persistent:  Headache.  Ear pain.  Sinus pain.  Chest pain.  You have chronic lung disease and any of the following:  Wheezing.  Prolonged cough.  Coughing up blood.  A change in your usual mucus.  You have a stiff neck.  You have changes in your:  Vision.  Hearing.  Thinking.  Mood. This information is not intended to replace advice given to you by your health care provider. Make sure you discuss any questions you have with your health care provider. Document Released: 01/05/2001 Document Revised: 03/14/2016 Document Reviewed: 10/17/2013 Elsevier Interactive Patient Education  2017 [employer].

## 2016-10-25 NOTE — Progress Notes (Signed)
[place] P Mckinzie is a 21 y.o. female here for a new problem.  History of Present Illness:   Chief Complaint  Patient presents with  . Sore Throat    started Friday  . BIlateral ear pain  . Cough    expectorating green sputum  . Chills    Saturday   HPI   Patient reports that since Friday she has had a scratchy throat, bilateral ear pain, cough with green sputum and chills. 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 She did receive a flu shot this year. She is eating and drinking well. She took Advil without relief, as well as Benadryl Allergy and Flonase. She denies SOB, difficulty breathing.  Patient's mom present for visit.  PMHx, SurgHx, SocialHx, Medications, and Allergies were reviewed in the Visit Navigator and updated as appropriate.  Current Medications:   Current Outpatient Prescriptions:  .  fluticasone (FLONASE) 50 MCG/ACT nasal spray, Place 2 sprays into both nostrils daily., Disp: , Rfl:  .  doxycycline (VIBRA-TABS) 100 MG tablet, Take 1 tablet (100 mg total) by mouth 2 (two) times daily., Disp: 20 tablet, Rfl: 0 .  predniSONE (DELTASONE) 20 MG tablet, Take 1 tablet (20 mg total) by mouth 2 (two) times daily with a meal., Disp: 10 tablet, Rfl: 0   Review of Systems:   Review of Systems  Constitutional: Positive for chills.  HENT: Positive for congestion, ear pain and sore throat.   Respiratory: Positive for cough and sputum production.   Gastrointestinal: Negative for diarrhea, nausea and vomiting.  Genitourinary: Negative.   Neurological: Positive for headaches.    Vitals:   Vitals:   10/25/16 1107  BP: 118/80  Pulse: 87  Temp: 98.5 F (36.9 C)  TempSrc: Oral  SpO2: 98%  Weight: 159 lb (72.1 kg)  Height:  (1.575 m)     Body mass index is 29.08 kg/m.  Physical Exam:   Physical Exam  Constitutional: She appears well-developed. She is cooperative.  Non-toxic appearance. She does not have a sickly  appearance. She does not appear ill. No distress.  HENT:  Head: Normocephalic and atraumatic.  Right Ear: External ear and ear canal normal. Tympanic membrane is scarred. Tympanic membrane is not erythematous, not retracted and not bulging.  Left Ear: External ear and ear canal normal. Tympanic membrane is scarred. Tympanic membrane is not erythematous, not retracted and not bulging.  Nose: Nose normal. Right sinus exhibits no maxillary sinus tenderness and no frontal sinus tenderness. Left sinus exhibits no maxillary sinus tenderness and no frontal sinus tenderness.  Mouth/Throat: Uvula is midline. Posterior oropharyngeal erythema present. No posterior oropharyngeal edema.  Tonsils absent bilaterally  Eyes: Conjunctivae and lids are normal.  Neck: Trachea normal.  Cardiovascular: Normal rate, regular rhythm, S1 normal, S2 normal and normal heart sounds.   Pulmonary/Chest: Effort normal and breath sounds normal. She has no decreased breath sounds. She has no wheezes. She has no rhonchi. She has no rales.  Lymphadenopathy:    She has no cervical adenopathy.  Neurological: She is alert.  Skin: Skin is warm, dry and intact.  Psychiatric: She has a normal mood and affect. Her speech is normal and behavior is normal.  Nursing note and vitals reviewed.    Assessment and Plan:    [place] was seen today for sore throat, bilateral ear pain, cough and chills.  Diagnoses and all orders for this visit:  Upper respiratory tract infection, unspecified type  Dysfunction  of both eustachian tubes  Other orders -     predniSONE (DELTASONE) 20 MG tablet; Take 1 tablet (20 mg total) by mouth 2 (two) times daily with a meal. -     doxycycline (VIBRA-TABS) 100 MG tablet; Take 1 tablet (100 mg total) by mouth 2 (two) times daily.   I discussed with patient that she likely has a viral illness as well as eustachian tube dysfunction. I advised that she take prednisone per orders. I would like for her to  continue Flonase daily and Benadryl if needed. May take Mucinex. I advised patient as follows: "You may begin the antibiotic if no improvement in a few days, but if you develop fever, worsening cough or sinus pressure, begin the antibiotic and take as directed. Follow-up with PCP in 7-10 days if no improvement, sooner if needed."  . Reviewed expectations re: course of current medical issues. . Discussed self-management of symptoms. . Outlined signs and symptoms indicating need for more acute intervention. . Patient verbalized understanding and all questions were answered. . See orders for this visit as documented in the electronic medical record. . Patient received an After-Visit Summary.  LPN served as Neurosurgeon during this visit. History, Physical, and Plan performed by Physician Assistant. Documentation and orders reviewed and attested to.   Jarold Motto, PA-C

## 2016-10-25 NOTE — Progress Notes (Signed)
Pre visit review using our clinic review tool, if applicable. No additional management support is needed unless otherwise documented below in the visit note. 

## 2016-11-02 DIAGNOSIS — F633 Trichotillomania: Secondary | ICD-10-CM | POA: Diagnosis not present

## 2016-11-04 DIAGNOSIS — Z79899 Other long term (current) drug therapy: Secondary | ICD-10-CM | POA: Diagnosis not present

## 2016-11-04 DIAGNOSIS — F633 Trichotillomania: Secondary | ICD-10-CM | POA: Diagnosis not present

## 2016-11-04 DIAGNOSIS — Z0289 Encounter for other administrative examinations: Secondary | ICD-10-CM | POA: Diagnosis not present

## 2016-11-05 DIAGNOSIS — H5213 Myopia, bilateral: Secondary | ICD-10-CM | POA: Diagnosis not present

## 2016-11-15 DIAGNOSIS — Z3009 Encounter for other general counseling and advice on contraception: Secondary | ICD-10-CM | POA: Diagnosis not present

## 2016-11-15 DIAGNOSIS — Z30430 Encounter for insertion of intrauterine contraceptive device: Secondary | ICD-10-CM | POA: Diagnosis not present

## 2016-11-15 DIAGNOSIS — Z3202 Encounter for pregnancy test, result negative: Secondary | ICD-10-CM | POA: Diagnosis not present

## 2016-11-19 DIAGNOSIS — F633 Trichotillomania: Secondary | ICD-10-CM | POA: Diagnosis not present

## 2016-12-01 DIAGNOSIS — F633 Trichotillomania: Secondary | ICD-10-CM | POA: Diagnosis not present

## 2016-12-10 DIAGNOSIS — F633 Trichotillomania: Secondary | ICD-10-CM | POA: Diagnosis not present

## 2016-12-28 DIAGNOSIS — Z30431 Encounter for routine checking of intrauterine contraceptive device: Secondary | ICD-10-CM | POA: Diagnosis not present

## 2017-01-07 DIAGNOSIS — F633 Trichotillomania: Secondary | ICD-10-CM | POA: Diagnosis not present

## 2017-08-22 ENCOUNTER — Ambulatory Visit: Payer: No Typology Code available for payment source | Admitting: Family Medicine

## 2017-08-22 ENCOUNTER — Encounter: Payer: Self-pay | Admitting: Family Medicine

## 2017-08-22 VITALS — BP 117/83 | HR 100 | Temp 98.1°F | Resp 20 | Wt 155.5 lb

## 2017-08-22 DIAGNOSIS — R6889 Other general symptoms and signs: Secondary | ICD-10-CM

## 2017-08-22 DIAGNOSIS — A084 Viral intestinal infection, unspecified: Secondary | ICD-10-CM

## 2017-08-22 LAB — POC INFLUENZA A&B (BINAX/QUICKVUE)
Influenza A, POC: NEGATIVE
Influenza B, POC: NEGATIVE

## 2017-08-22 MED ORDER — ONDANSETRON HCL 4 MG PO TABS: 4.0000 mg | ORAL_TABLET | Freq: Three times a day (TID) | ORAL | 0 refills | Status: DC | PRN

## 2017-08-22 MED ORDER — DICYCLOMINE HCL 20 MG PO TABS: 20.0000 mg | ORAL_TABLET | Freq: Four times a day (QID) | ORAL | 0 refills | Status: DC

## 2017-08-22 NOTE — Patient Instructions (Addendum)
Zofran: is for nausea. Take every 8 hours for first 24 hours and then as needed for nausea. DRINK plenty of water and G2 . Clear liquid diet for 24 hours and advance slowly to soft diet.  Avoid dairy products until better.   Bentyl: Take up to every 8 hours as needed.   Viral Gastroenteritis, Adult Viral gastroenteritis is also known as the stomach flu. This condition is caused by certain germs (viruses). These germs can be passed from person to person very easily (are very contagious). This condition can cause sudden watery poop (diarrhea), fever, and throwing up (vomiting). Having watery poop and throwing up can make you feel weak and cause you to get dehydrated. Dehydration can make you tired and thirsty, make you have a dry mouth, and make it so you pee (urinate) less often. Older adults and people with other diseases or a weak defense system (immune system) are at higher risk for dehydration. It is important to replace the fluids that you lose from having watery poop and throwing up. Follow these instructions at home: Follow instructions from your doctor about how to care for yourself at home. Eating and drinking  Follow these instructions as told by your doctor:  Take an oral rehydration solution (ORS). This is a drink that is sold at pharmacies and stores.  Drink clear fluids in small amounts as you are able, such as: ? Water. ? Ice chips. ? Diluted fruit juice. ? Low-calorie sports drinks.  Eat bland, easy-to-digest foods in small amounts as you are able, such as: ? Bananas. ? Applesauce. ? Rice. ? Low-fat (lean) meats. ? Toast. ? Crackers.  Avoid fluids that have a lot of sugar or caffeine in them.  Avoid alcohol.  Avoid spicy or fatty foods.  General instructions  Drink enough fluid to keep your pee (urine) clear or pale yellow.  Wash your hands often. If you cannot use soap and water, use hand sanitizer.  Make sure that all people in your home wash their hands  well and often.  Rest at home while you get better.  Take over-the-counter and prescription medicines only as told by your doctor.  Watch your condition for any changes.  Take a warm bath to help with any burning or pain from having watery poop.  Keep all follow-up visits as told by your doctor. This is important. Contact a doctor if:  You cannot keep fluids down.  Your symptoms get worse.  You have new symptoms.  You feel light-headed or dizzy.  You have muscle cramps. Get help right away if:  You have chest pain.  You feel very weak or you pass out (faint).  You see blood in your throw-up.  Your throw-up looks like coffee grounds.  You have bloody or black poop (stools) or poop that look like tar.  You have a very bad headache, a stiff neck, or both.  You have a rash.  You have very bad pain, cramping, or bloating in your belly (abdomen).  You have trouble breathing.  You are breathing very quickly.  Your heart is beating very quickly.  Your skin feels cold and clammy.  You feel confused.  You have pain when you pee.  You have signs of dehydration, such as: ? Dark pee, hardly any pee, or no pee. ? Cracked lips. ? Dry mouth. ? Sunken eyes. ? Sleepiness. ? Weakness. This information is not intended to replace advice given to you by your health care provider. Make sure you  discuss any questions you have with your health care provider. Document Released: 12/29/2007 Document Revised: 01/30/2016 Document Reviewed: 03/18/2015 Elsevier Interactive Patient Education  2017 [employer].

## 2017-08-22 NOTE — Progress Notes (Signed)
Kimberly Fuentes , [birth year], 22 y.o., female MRN: [id number] Patient Care Team    Relationship Specialty Notifications Start End  Natalia LeatherwoodKuneff, Renee A, DO PCP - General Family Medicine  03/27/15     Chief Complaint  Patient presents with  . Emesis    x 1 day  . Diarrhea    started this am     Subjective: Pt presents for an OV with complaints of Vomiting and diarrhea of 1-2 days duration.  Associated symptoms include chills and fatigue. She reports people at work have been out with similar symptoms. She has had no appetite. She endorses vomit x6 yesterday. Diarrhea started this morning with abd cramps. She denies abx use, under prepared meals or travel.  Flu shot UTD 2018.   Depression screen [facility] 2/9 08/22/2017 04/14/2016  Decreased Interest 0 0  Down, Depressed, Hopeless 0 0  PHQ - 2 Score 0 0    Allergies  Allergen Reactions  . Cefazolin Hives  . Penicillins Hives  . Hydrocodone Itching    Facial itching when taking after wisdom teeth extraction   Social History   Tobacco Use  . Smoking status: Passive Smoke Exposure - Never Smoker  . Smokeless tobacco: Never Used  Substance Use Topics  . Alcohol use: No   Past Medical History:  Diagnosis Date  . Allergy   . UTI (lower urinary tract infection)    Past Surgical History:  Procedure Laterality Date  . TONSILLECTOMY AND ADENOIDECTOMY  2000  . WISDOM TOOTH EXTRACTION  2012   Family History  Problem Relation Age of Onset  . Miscarriages / Stillbirths Mother    Allergies as of 08/22/2017      Reactions   Cefazolin Hives   Penicillins Hives   Hydrocodone Itching   Facial itching when taking after wisdom teeth extraction      Medication List        Accurate as of 08/22/17  2:52 PM. Always use your most recent med list.          dicyclomine 20 MG tablet Commonly known as:  BENTYL Take 1 tablet (20 mg total) by mouth every 6 (six) hours.   ondansetron 4 MG tablet Commonly known as:  ZOFRAN Take 1  tablet (4 mg total) by mouth every 8 (eight) hours as needed for nausea or vomiting.       All past medical history, surgical history, allergies, family history, immunizations andmedications were updated in the EMR today and reviewed under the history and medication portions of their EMR.     ROS: Negative, with the exception of above mentioned in HPI   Objective:  BP 117/83 (BP Location: Right Arm, Patient Position: Sitting, Cuff Size: Normal)   Pulse 100   Temp 98.1 F (36.7 C)   Resp 20   Wt 155 lb 8 oz (70.5 kg)   SpO2 98%   BMI 28.44 kg/m  Body mass index is 28.44 kg/m. Gen: Afebrile. No acute distress. Nontoxic in appearance, well developed, well nourished.  HENT: AT. [place]. Bilateral TM visualized with erythema or bulging. MMM, no oral lesions. Bilateral nares with mild erythema and drainage. Throat without erythema or exudates. No cough or hoarseness Eyes:Pupils Equal Round Reactive to light, Extraocular movements intact,  Conjunctiva without redness, discharge or icterus. Neck/lymp/endocrine: Supple,no lymphadenopathy CV: RRR no murmur, noedema Chest: CTAB, no wheeze or crackles. Good air movement, normal resp effort.  Abd: Soft. flat.ND. Mild tenderness BS present. no Masses palpated. No rebound or  guarding.  Skin: no rashes, purpura or petechiae.  Neuro:  Normal gait. PERLA. EOMi. Alert. Oriented x3  No exam data present No results found. Results for orders placed or performed in visit on 08/22/17 (from the past 24 hour(s))  POC Influenza A&B (Binax test)     Status: Normal   Collection Time: 08/22/17  1:56 PM  Result Value Ref Range   Influenza A, POC Negative Negative   Influenza B, POC Negative Negative    Assessment/Plan: Kimberly Fuentes is a 22 y.o. female present for OV for  Flu-like symptoms - POC Influenza A&B (Binax test)--> negative Viral gastroenteritis - rest, HYDRATE.  - zofran for nausea. Take every 8 hours for 24 hours to allow for  rehydration.  - Clear liquid diet for 24 hours then advance slowly.  - avoid dairy. - Avoid antidiarrheal unless > 10 stools.  - Bentyl for stomach cramps.  - F/U 1 week if not improved, sooner if worsening.    Reviewed expectations re: course of current medical issues.  Discussed self-management of symptoms.  Outlined signs and symptoms indicating need for more acute intervention.  Patient verbalized understanding and all questions were answered.  Patient received an After-Visit Summary.    Orders Placed This Encounter  Procedures  . POC Influenza A&B (Binax test)     Note is dictated utilizing voice recognition software. Although note has been proof read prior to signing, occasional typographical errors still can be missed. If any questions arise, please do not hesitate to call for verification.   electronically signed by:  Felix Pacini, DO  [facility] [place]

## 2017-10-13 ENCOUNTER — Telehealth: Payer: Self-pay | Admitting: Family Medicine

## 2017-10-13 NOTE — Telephone Encounter (Signed)
Copied from CRM [id number]. Topic: Quick Communication - See Telephone Encounter >> Oct 13, 2017  4:18 PM Kimberly Fuentes, [name] wrote: CRM for notification. See Telephone encounter for: 10/13/17. Pt would like to know if office has copy of her immunization records. Such as tider test, flu, tetanus, tb. She's needing verification of receiving them for work. Ok to leave on VM.

## 2017-10-13 NOTE — Telephone Encounter (Signed)
Contacted patient, advised her of what immunizations / titers we had and faxed them to [phone] to her attention as requested.

## 2017-10-21 ENCOUNTER — Telehealth: Payer: Self-pay | Admitting: Family Medicine

## 2017-10-21 NOTE — Telephone Encounter (Signed)
faxed

## 2017-10-24 LAB — BASIC METABOLIC PANEL
BUN: 12 (ref 4–21)
CREATININE: 1 (ref 0.5–1.1)
GLUCOSE: 86
Potassium: 4.1 (ref 3.4–5.3)
Sodium: 143 (ref 137–147)

## 2017-10-24 LAB — HEPATIC FUNCTION PANEL
ALK PHOS: 111 (ref 25–125)
ALT: 19 (ref 7–35)
AST: 21 (ref 13–35)
BILIRUBIN, TOTAL: 0.2

## 2017-10-24 LAB — LIPID PANEL
Cholesterol: 136 (ref 0–200)
HDL: 47 (ref 35–70)
LDL Cholesterol: 60
TRIGLYCERIDES: 143 (ref 40–160)

## 2017-10-24 LAB — CBC AND DIFFERENTIAL
HCT: 43 (ref 36–46)
Hemoglobin: 14.4 (ref 12.0–16.0)
WBC: 10.3

## 2017-10-24 LAB — HEMOGLOBIN A1C: HEMOGLOBIN A1C: 4.9

## 2017-10-24 LAB — VITAMIN D 25 HYDROXY (VIT D DEFICIENCY, FRACTURES): Vit D, 25-Hydroxy: 26.5

## 2017-10-24 LAB — TSH: TSH: 4.31 (ref 0.41–5.90)

## 2017-10-27 ENCOUNTER — Encounter: Payer: Self-pay | Admitting: Family Medicine

## 2017-11-01 ENCOUNTER — Telehealth: Payer: Self-pay | Admitting: Family Medicine

## 2017-11-01 NOTE — Telephone Encounter (Addendum)
Please inform patient the following information: I have received labs from an outside source on patient with a CMP, CBC, lipid panel, TSH and vitamin D [facility] With a note attached to review and advise her on abnormal results.  Although we appreciate the update on the labs collected. The abnormal results need to be managed by the ordering physician. I am uncertain the setting the labs were completed. If she is asking we manage abnormal  Labs, then she would need to be seen here. It does appear she is overdue for her CPE and PAP, unless she has that completed elsewhere (in which we would need copies to update her chart). We could complete her physical and go over any concerns she has with her labs collected elsewhere or she can make an appt to discuss her concerns about labs alone if she would like us to manage it.

## 2017-11-02 NOTE — Telephone Encounter (Signed)
Called number listed for patient and was told it was wrong number.

## 2017-11-02 NOTE — Telephone Encounter (Signed)
There was a number listed on the labs. She may have a new number.

## 2017-11-03 NOTE — Telephone Encounter (Signed)
Unable to reach patient by phone. Mailed a letter to patient requesting she contact our office to update her phone number and review information about recent fax she sent to our office.

## 2017-11-29 ENCOUNTER — Ambulatory Visit (INDEPENDENT_AMBULATORY_CARE_PROVIDER_SITE_OTHER): Payer: Commercial Managed Care - PPO | Admitting: Podiatry

## 2017-11-29 ENCOUNTER — Ambulatory Visit (INDEPENDENT_AMBULATORY_CARE_PROVIDER_SITE_OTHER): Payer: Commercial Managed Care - PPO

## 2017-11-29 ENCOUNTER — Encounter: Payer: Self-pay | Admitting: Podiatry

## 2017-11-29 VITALS — BP 102/60 | HR 83 | Resp 16

## 2017-11-29 DIAGNOSIS — M722 Plantar fascial fibromatosis: Secondary | ICD-10-CM

## 2017-11-29 MED ORDER — METHYLPREDNISOLONE 4 MG PO TBPK: ORAL_TABLET | ORAL | 0 refills | Status: DC

## 2017-11-29 MED ORDER — MELOXICAM 15 MG PO TABS: 15.0000 mg | ORAL_TABLET | Freq: Every day | ORAL | 3 refills | Status: DC

## 2017-11-29 NOTE — Patient Instructions (Signed)

## 2017-11-30 NOTE — Progress Notes (Signed)
  Subjective:  Patient ID: Kimberly Fuentes, female    DOB: [birth year],  MRN: [id number] HPI Chief Complaint  Patient presents with  . Foot Pain    Arch bilateral (R>L) - aching x 2 months, some AM pain, has tried arch braces-some help  . New Patient (Initial Visit)    22 y.o. female presents with the above complaint.   ROS: Denies fever chills nausea vomiting muscle aches pains calf pain back pain chest pain shortness of breath and headache.  Past Medical History:  Diagnosis Date  . Allergy   . UTI (lower urinary tract infection)    Past Surgical History:  Procedure Laterality Date  . TONSILLECTOMY AND ADENOIDECTOMY  2000  . WISDOM TOOTH EXTRACTION  2012    Current Outpatient Medications:  .  meloxicam (MOBIC) 15 MG tablet, Take 1 tablet (15 mg total) by mouth daily., Disp: 30 tablet, Rfl: 3 .  methylPREDNISolone (MEDROL DOSEPAK) 4 MG TBPK tablet, 6 day dose pack - take as directed, Disp: 21 tablet, Rfl: 0  Allergies  Allergen Reactions  . Cefazolin Hives  . Penicillins Hives  . Hydrocodone Itching    Facial itching when taking after wisdom teeth extraction   Review of Systems Objective:   Vitals:   11/29/17 1642  BP: 102/60  Pulse: 83  Resp: 16    General: Well developed, nourished, in no acute distress, alert and oriented x3   Dermatological: Skin is warm, dry and supple bilateral. Nails x 10 are well maintained; remaining integument appears unremarkable at this time. There are no open sores, no preulcerative lesions, no rash or signs of infection present.  Vascular: Dorsalis Pedis artery and Posterior Tibial artery pedal pulses are 2/4 bilateral with immedate capillary fill time. Pedal hair growth present. No varicosities and no lower extremity edema present bilateral.   Neruologic: Grossly intact via light touch bilateral. Vibratory intact via tuning fork bilateral. Protective threshold with Semmes Wienstein monofilament intact to all pedal sites bilateral.  Patellar and Achilles deep tendon reflexes 2+ bilateral. No Babinski or clonus noted bilateral.   Musculoskeletal: No gross boney pedal deformities bilateral. No pain, crepitus, or limitation noted with foot and ankle range of motion bilateral. Muscular strength 5/5 in all groups tested bilateral.  She has pain on palpation medial calcaneal tubercles bilateral.  No pain on medial lateral compression of the calcaneus.  Gait: Unassisted, Nonantalgic.    Radiographs:  3 views bilateral foot taken today demonstrates a normal osseous mature individual with rectus foot type.  Mild soft tissue increase in density at the plantar fascial calcaneal insertion site without spurs.  No acute findings noted.  Assessment & Plan:   Assessment: Plantar fasciitis bilateral.    Plan: Discussed etiology pathology conservative versus surgical therapies.  At this point after sterile Betadine skin prep I injected 20 mg Kenalog 5 mg Marcaine to the medial aspect of the bilateral heels.  Tolerated procedure well without complications.  Started her on a Medrol Dosepak to be followed by meloxicam and discussed appropriate shoe gear stretching exercise ice therapy sugar modifications.  Also discussed plantar fascial braces and a single night splint.  She will also have to have some orthotics made.  She was casted today for those.     Theola Cuellar T. [place], [place]

## 2018-01-11 ENCOUNTER — Ambulatory Visit: Payer: Self-pay

## 2018-01-11 ENCOUNTER — Encounter: Payer: Self-pay | Admitting: Family Medicine

## 2018-01-11 ENCOUNTER — Ambulatory Visit (INDEPENDENT_AMBULATORY_CARE_PROVIDER_SITE_OTHER): Payer: Commercial Managed Care - PPO | Admitting: Family Medicine

## 2018-01-11 VITALS — BP 109/76 | HR 76 | Temp 98.8°F | Resp 16 | Ht 62.0 in | Wt 162.1 lb

## 2018-01-11 DIAGNOSIS — G43909 Migraine, unspecified, not intractable, without status migrainosus: Secondary | ICD-10-CM | POA: Diagnosis not present

## 2018-01-11 DIAGNOSIS — R42 Dizziness and giddiness: Secondary | ICD-10-CM

## 2018-01-11 DIAGNOSIS — G44201 Tension-type headache, unspecified, intractable: Secondary | ICD-10-CM | POA: Diagnosis not present

## 2018-01-11 MED ORDER — KETOROLAC TROMETHAMINE 60 MG/2ML IM SOLN
60.0000 mg | Freq: Once | INTRAMUSCULAR | Status: AC
  Administered 2018-01-11: 60 mg via INTRAMUSCULAR

## 2018-01-11 NOTE — Progress Notes (Signed)
OFFICE VISIT  01/11/2018   CC:  Chief Complaint  Patient presents with  . Dizziness    lightheaded, trouble standing, headache   HPI:    Patient is a 22 y.o. Caucasian female who presents for headache, also lightheaded/dizzy. Onset of HA in entire forehead/retroorbital regions (throbbing) in the evening 2 d/a, HA all the next day--excedrin taken and no help.  She then started feeling a vertiginous sensation.  Vertigo lasted 2 hours but was most intense for 2-3 min maximum.  Feels fine lying down, getting up and moving around (? Position change?) makes sx's worse.  No nausea.  No fever. Malaise and fatigue yesterday.  +Photophobia.  Describes a lot of orthostatic dizziness as well.  No vision abnormality. Drinks fluids well--water and tea, no sodas.  Eating normally. Gets tension HA's daily0--worsening in severity and length the last 1 mo.   Describes about 2 migraine type HAs per month.  Has never had migraine med before.  Only takes excedrin for HA--has been doing this q24h x 2 wks. No acute stress lately.  No recent new meds. No known sick contacts.  No persistent URI sx's, no ST, no cough. Yesterday at work her vitals, glucose and hb were normal.  EKG normal (works at [employer] in [place])---all per pt report; no results available to me today  ROS: no focal weakness, no paresthesias, no hearing loss, no ringing in ears, no bowel/bladder dysfunction, no dysarthria or dysphagia..  Past Medical History:  Diagnosis Date  . Allergy   . UTI (lower urinary tract infection)     Past Surgical History:  Procedure Laterality Date  . TONSILLECTOMY AND ADENOIDECTOMY  2000  . WISDOM TOOTH EXTRACTION  2012    Outpatient Medications Prior to Visit  Medication Sig Dispense Refill  . aspirin-acetaminophen-caffeine (EXCEDRIN MIGRAINE) 250-250-65 MG tablet Take 1 tablet by mouth every 6 (six) hours as needed for headache.    . meloxicam (MOBIC) 15 MG tablet Take 1 tablet (15 mg total) by mouth daily.  (Patient not taking: Reported on 01/11/2018) 30 tablet 3  . methylPREDNISolone (MEDROL DOSEPAK) 4 MG TBPK tablet 6 day dose pack - take as directed (Patient not taking: Reported on 01/11/2018) 21 tablet 0   No facility-administered medications prior to visit.     Allergies  Allergen Reactions  . Cefazolin Hives  . Penicillins Hives  . Hydrocodone Itching    Facial itching when taking after wisdom teeth extraction    ROS As per HPI  PE: Blood pressure 109/76, pulse 76, temperature 98.8 F (37.1 C), temperature source Oral, resp. rate 16, height 5\' 2"  (1.575 m), weight 162 lb 2 oz (73.5 kg), SpO2 98 %. Body mass index is 29.65 kg/m.  Gen: Alert, well appearing.  Patient is oriented to person, place, time, and situation. AFFECT: pleasant, lucid thought and speech. ZOX:WRUEENT:Eyes: no injection, icteris, swelling, or exudate.  EOMI, PERRLA. Mouth: lips without lesion/swelling.  Oral mucosa pink and moist. Oropharynx without erythema, exudate, or swelling.  Neck - No masses or thyromegaly or limitation in range of motion CV: RRR, no m/r/g.   LUNGS: CTA bilat, nonlabored resps, good aeration in all lung fields. EXT: no clubbing, cyanosis, or edema.  Neuro: CN 2-12 intact bilaterally, strength 5/5 in proximal and distal upper extremities and lower extremities bilaterally.  No sensory deficits.  No tremor.  No disdiadochokinesis.  No ataxia.  Upper extremity and lower extremity DTRs symmetric.  No pronator drift. Dix Halpike: R -->+vertigo but no nystagmus.  L-->  question of "don't feel right" but no distinct vertigo, no nystagmus.  LABS:    Chemistry      Component Value Date/Time   NA 143 10/24/2017   K 4.1 10/24/2017   BUN 12 10/24/2017   CREATININE 1.0 10/24/2017   GLU 86 10/24/2017      Component Value Date/Time   ALKPHOS 111 10/24/2017   AST 21 10/24/2017   ALT 19 10/24/2017     Lab Results  Component Value Date   WBC 10.3 10/24/2017   HGB 14.4 10/24/2017   HCT 43 10/24/2017      IMPRESSION AND PLAN:  Worsening daily HAs--mostly tension type but definitely has a migraine syndrome as well (no aura)--now with new onset vertigo that is not totally convincing for BPPV. Stop excedrin. Most recent dose of excedrin was yesterday. Toradol 60mg  IM in office today. No more NSAIDs for now. No triptan for now. I want to check an MRI brain to r/o AVM, mass, aneurism, ventriculomegaly, acoustic neuroma. If this is neg then will rx triptan to see if this helps as abortive med for her HA's. Once abortive med is found that helps, we can concentrate on daily preventative med.  I did go over home Epley maneuvers, gave handout--just in case this is BPPV.  An After Visit Summary was printed and given to the patient.  FOLLOW UP: Return in about 1 week (around 01/18/2018) for f/u with Dr. Claiborne Billings (or myself if Dr. Claiborne Billings not available).  Signed:  Santiago Bumpers, MD           01/11/2018

## 2018-01-11 NOTE — Telephone Encounter (Signed)
Noted  

## 2018-01-11 NOTE — Telephone Encounter (Signed)
Pt has apt today w/ Dr. Milinda CaveMcGowen at 1:30pm.

## 2018-01-11 NOTE — Telephone Encounter (Signed)
Pt. Reports she has been having more headaches than normal. Yesterday had an episode of dizziness at work. Went home and rested the rest of the day. Woke up today - has a headache and still has some dizziness. States at work yesterday they checked her vital signs and they were normal.Also did an ECG - normal. Appointment made for today by agent. Reason for Disposition . [1] MODERATE dizziness (e.g., interferes with normal activities) AND [2] has NOT been evaluated by physician for this  (Exception: dizziness caused by heat exposure, sudden standing, or poor fluid intake)  Answer Assessment - Initial Assessment Questions 1. DESCRIPTION: "Describe your dizziness."     Woozy 2. LIGHTHEADED: "Do you feel lightheaded?" (e.g., somewhat faint, woozy, weak upon standing)     Wobbly 3. VERTIGO: "Do you feel like either you or the room is spinning or tilting?" (i.e. vertigo)     Yes 4. SEVERITY: "How bad is it?"  "Do you feel like you are going to faint?" "Can you stand and walk?"   - MILD - walking normally   - MODERATE - interferes with normal activities (e.g., work, school)    - SEVERE - unable to stand, requires support to walk, feels like passing out now.      Mild - has a headache as well 5. ONSET:  "When did the dizziness begin?"      Yesterday 6. AGGRAVATING FACTORS: "Does anything make it worse?" (e.g., standing, change in head position)     Changing positions made it worse 7. HEART RATE: "Can you tell me your heart rate?" "How many beats in 15 seconds?"  (Note: not all patients can do this)       Normal 8. CAUSE: "What do you think is causing the dizziness?"     Unsure 9. RECURRENT SYMPTOM: "Have you had dizziness before?" If so, ask: "When was the last time?" "What happened that time?"     No 10. OTHER SYMPTOMS: "Do you have any other symptoms?" (e.g., fever, chest pain, vomiting, diarrhea, bleeding)       Headache 11. PREGNANCY: "Is there any chance you are pregnant?" "When was your  last menstrual period?"       No  Protocols used: DIZZINESS [facility]- LIGHTHEADEDNESS-A-AH

## 2018-01-15 ENCOUNTER — Ambulatory Visit
Admission: RE | Admit: 2018-01-15 | Discharge: 2018-01-15 | Disposition: A | Discharge status: Home / Self Care | Payer: Commercial Managed Care - PPO | Source: Ambulatory Visit | Attending: Family Medicine | Admitting: Family Medicine

## 2018-01-15 DIAGNOSIS — R42 Dizziness and giddiness: Secondary | ICD-10-CM

## 2018-01-15 DIAGNOSIS — G43909 Migraine, unspecified, not intractable, without status migrainosus: Secondary | ICD-10-CM

## 2018-01-15 DIAGNOSIS — G44201 Tension-type headache, unspecified, intractable: Secondary | ICD-10-CM

## 2018-01-15 MED ORDER — GADOBENATE DIMEGLUMINE 529 MG/ML IV SOLN
15.0000 mL | Freq: Once | INTRAVENOUS | Status: AC | PRN
  Administered 2018-01-15: 15 mL via INTRAVENOUS

## 2018-01-20 ENCOUNTER — Encounter: Payer: Self-pay | Admitting: Family Medicine

## 2018-01-20 ENCOUNTER — Ambulatory Visit (INDEPENDENT_AMBULATORY_CARE_PROVIDER_SITE_OTHER): Payer: Commercial Managed Care - PPO | Admitting: Family Medicine

## 2018-01-20 VITALS — BP 121/80 | HR 86 | Resp 16 | Ht 62.0 in | Wt 165.0 lb

## 2018-01-20 DIAGNOSIS — R51 Headache: Secondary | ICD-10-CM

## 2018-01-20 DIAGNOSIS — G44209 Tension-type headache, unspecified, not intractable: Secondary | ICD-10-CM

## 2018-01-20 DIAGNOSIS — E569 Vitamin deficiency, unspecified: Secondary | ICD-10-CM | POA: Diagnosis not present

## 2018-01-20 DIAGNOSIS — R519 Headache, unspecified: Secondary | ICD-10-CM

## 2018-01-20 MED ORDER — CYCLOBENZAPRINE HCL 5 MG PO TABS: 5.0000 mg | ORAL_TABLET | Freq: Every day | ORAL | 0 refills | Status: DC

## 2018-01-20 MED ORDER — METHYLPREDNISOLONE ACETATE 80 MG/ML IJ SUSP
80.0000 mg | Freq: Once | INTRAMUSCULAR | Status: AC
  Administered 2018-01-20: 80 mg via INTRAMUSCULAR

## 2018-01-20 NOTE — Progress Notes (Signed)
Kimberly FinesBrittany P Fuentes , [birth year], 22 y.o., female MRN: [id number] Patient Care Team    Relationship Specialty Notifications Start End  Natalia LeatherwoodKuneff, Nikan Ellingson A, DO PCP - General Family Medicine  03/27/15     Chief Complaint  Patient presents with  . Follow-up    migraines     Subjective: Pt presents for an OV with complaints of increased headaches of 2 month duration.  Associated symptoms include mild vertigo like symptoms. She reports her headaches are daily, she wakes with them. Are not intense enough to miss work. Her eye exam is overdue and she has an appt tomorrow to check her prescription glasses. She denies nausea, vomit, visual changes or syncope. She reports the discomfort is above her eyes. She denies increased stress. She is sleeping well. She was seen a few weeks ago by Dr. Milinda CaveMcGowen and toradol injection was provided and MRI ordered. MRI was without acute cause for headaches. She has refrained from taking the Excedrin. She also stopped caffeine use 2 months ago. She has a Mirena, which has been in place for about 2 years.  [facility] 08/22/2017 04/14/2016  Decreased Interest 0 0  Down, Depressed, Hopeless 0 0  PHQ - 2 Score 0 0    Allergies  Allergen Reactions  . Cefazolin Hives  . Penicillins Hives  . Hydrocodone Itching    Facial itching when taking after wisdom teeth extraction   Social History   Tobacco Use  . Smoking status: Passive Smoke Exposure - Never Smoker  . Smokeless tobacco: Never Used  Substance Use Topics  . Alcohol use: No   Past Medical History:  Diagnosis Date  . Allergy   . UTI (lower urinary tract infection)    Past Surgical History:  Procedure Laterality Date  . TONSILLECTOMY AND ADENOIDECTOMY  2000  . WISDOM TOOTH EXTRACTION  2012   Family History  Problem Relation Age of Onset  . Miscarriages / Stillbirths Mother    Allergies as of 01/20/2018      Reactions   Cefazolin Hives   Penicillins Hives   Hydrocodone Itching   Facial itching when taking after wisdom teeth extraction      Medication List        Accurate as of 01/20/18  3:37 PM. Always use your most recent med list.          aspirin-acetaminophen-caffeine 250-250-65 MG tablet Commonly known as:  EXCEDRIN MIGRAINE Take 1 tablet by mouth every 6 (six) hours as needed for headache.   levonorgestrel 20 MCG/24HR IUD Commonly known as:  MIRENA 1 each by Intrauterine route once.       All past medical history, surgical history, allergies, family history, immunizations andmedications were updated in the EMR today and reviewed under the history and medication portions of their EMR.     ROS: Negative, with the exception of above mentioned in HPI   Objective:  BP 121/80 (BP Location: Left Arm, Patient Position: Sitting, Cuff Size: Normal)   Pulse 86   Resp 16   Ht 5\' 2"  (1.575 m)   Wt 165 lb (74.8 kg)   SpO2 97%   BMI 30.18 kg/m  Body mass index is 30.18 kg/m. Gen: Afebrile. No acute distress. Nontoxic in appearance, well developed, well nourished.  HENT: AT. [place]. Bilateral TM visualized WNL. MMM, no oral lesions. Bilateral nares WNL. Throat without erythema or exudates. No cough or hoarseness Eyes:Pupils Equal Round Reactive to light, Extraocular movements intact,  Conjunctiva without redness, discharge  or icterus. Neck/lymp/endocrine: Supple,no lymphadenopathy CV: RRR no murmur, no edema Chest: CTAB, no wheeze or crackles. Good air movement, normal resp effort.  Abd: Soft. NTND. BS present.  Skin: no rashes, purpura or petechiae.  Neuro:  Normal gait. PERLA. EOMi. Alert. Oriented x3 Cranial nerves II through XII intact. Muscle strength 5/5 bilateral u/l extremity. DTRs equal bilaterally. Psych: Normal affect, dress and demeanor. Normal speech. Normal thought content and judgment.  No exam data present No results found. No results found for this or any previous visit (from the past 24 hour(s)).  Assessment/Plan: Kimberly Fuentes  is a 22 y.o. female present for OV for  Daily headache/tension headache - exam more consistent with tension headache or eye strain. Discussed different type of headaches today.  - MRI reassuring. - she is having her eyes checked tomorrow.  - start mag/b12.  - IM depo medrol today, start flexeril tonight for 3-5 nights, then PRN - consider restarting caffeine use at low dose or imitrex - Vitamin D (25 hydroxy) - Magnesium - B12 - cyclobenzaprine (FLEXERIL) 5 MG tablet; Take 1 tablet (5 mg total) by mouth at bedtime.  Dispense: 30 tablet; Refill: 0 - methylPREDNISolone acetate (DEPO-MEDROL) injection 80 mg  H/o Vitamin deficiency - Vitamin D (25 hydroxy)   Reviewed expectations re: course of current medical issues.  Discussed self-management of symptoms.  Outlined signs and symptoms indicating need for more acute intervention.  Patient verbalized understanding and all questions were answered.  Patient received an After-Visit Summary.    No orders of the defined types were placed in this encounter.    Note is dictated utilizing voice recognition software. Although note has been proof read prior to signing, occasional typographical errors still can be missed. If any questions arise, please do not hesitate to call for verification.   electronically signed by:  Felix Pacini, DO  [facility] [place]

## 2018-01-20 NOTE — Patient Instructions (Addendum)
Start flexeril nightly before bed for at least 3-5 days. Then can use as needed.  Steroid shot today.  Follow up in 2 weeks. With headache log.   Try magnesium 400-500 mg a day and add b12 [phone] mcg a day.  We will call with labs in about a week.     Tension Headache A tension headache is pain, pressure, or aching that is felt over the front and sides of your head. These headaches can last from 30 minutes to several days. Follow these instructions at home: Managing pain  Take over-the-counter and prescription medicines only as told by your doctor.  Lie down in a dark, quiet room when you have a headache.  If directed, apply ice to your head and neck area: ? Put ice in a plastic bag. ? Place a towel between your skin and the bag. ? Leave the ice on for 20 minutes, 2-3 times per day.  Use a heating pad or a hot shower to apply heat to your head and neck area as told by your doctor. Eating and drinking  Eat meals on a regular schedule.  Do not drink a lot of alcohol.  Do not use a lot of caffeine, or stop using caffeine. General instructions  Keep all follow-up visits as told by your doctor. This is important.  Keep a journal to find out if certain things bring on headaches. For example, write down: ? What you eat and drink. ? How much sleep you get. ? Any change to your diet or medicines.  Try getting a massage, or doing other things that help you to relax.  Lessen stress.  Sit up straight. Do not tighten (tense) your muscles.  Do not use tobacco products. This includes cigarettes, chewing tobacco, or e-cigarettes. If you need help quitting, ask your doctor.  Exercise regularly as told by your doctor.  Get enough sleep. This may mean 7-9 hours of sleep. Contact a doctor if:  Your symptoms are not helped by medicine.  You have a headache that feels different from your usual headache.  You feel sick to your stomach (nauseous) or you throw up (vomit).  You  have a fever. Get help right away if:  Your headache becomes very bad.  You keep throwing up.  You have a stiff neck.  You have trouble seeing.  You have trouble speaking.  You have pain in your eye or ear.  Your muscles are weak or you lose muscle control.  You lose your balance or you have trouble walking.  You feel like you will pass out (faint) or you pass out.  You have confusion. This information is not intended to replace advice given to you by your health care provider. Make sure you discuss any questions you have with your health care provider. Document Released: 10/06/2009 Document Revised: 03/11/2016 Document Reviewed: 11/04/2014 Elsevier Interactive Patient Education  [employer]2018 [employer]

## 2018-01-21 LAB — VITAMIN B12: VITAMIN B 12: 591 pg/mL (ref 200–1100)

## 2018-01-21 LAB — VITAMIN D 25 HYDROXY (VIT D DEFICIENCY, FRACTURES): VIT D 25 HYDROXY: 32 ng/mL (ref 30–100)

## 2018-01-21 LAB — MAGNESIUM: Magnesium: 2 mg/dL (ref 1.5–2.5)

## 2018-01-30 ENCOUNTER — Telehealth: Payer: Self-pay | Admitting: Family Medicine

## 2018-01-30 NOTE — Telephone Encounter (Signed)
Unable to reach patient via phone, mychart message sent to patient.

## 2018-01-30 NOTE — Telephone Encounter (Signed)
Please inform patient the following information: Her magnesium, b12 and vit d are all in normal ranges. A daily mag( 400 - 500 mg) and b12 could still help with headaches in some and worth a try.  If headaches are not improved with recs from her appt and med prescribed, she can also try to add back low dose caffeine as well.   If not improving follow up.

## 2018-01-30 NOTE — Telephone Encounter (Signed)
Phone call attempted, voice mail full and unable to take messages. Will try call back at later time.

## 2018-02-03 ENCOUNTER — Ambulatory Visit (INDEPENDENT_AMBULATORY_CARE_PROVIDER_SITE_OTHER): Payer: Commercial Managed Care - PPO | Admitting: Family Medicine

## 2018-02-03 ENCOUNTER — Encounter: Payer: Self-pay | Admitting: Family Medicine

## 2018-02-03 VITALS — BP 122/73 | HR 92 | Resp 16 | Ht 62.0 in | Wt 159.0 lb

## 2018-02-03 DIAGNOSIS — R519 Headache, unspecified: Secondary | ICD-10-CM | POA: Insufficient documentation

## 2018-02-03 DIAGNOSIS — R51 Headache: Secondary | ICD-10-CM | POA: Diagnosis not present

## 2018-02-03 DIAGNOSIS — G8929 Other chronic pain: Secondary | ICD-10-CM

## 2018-02-03 HISTORY — DX: Headache, unspecified: R51.9

## 2018-02-03 HISTORY — DX: Other chronic pain: G89.29

## 2018-02-03 MED ORDER — AMITRIPTYLINE HCL 10 MG PO TABS: ORAL_TABLET | ORAL | 0 refills | Status: DC

## 2018-02-03 NOTE — Patient Instructions (Signed)
Start amitriptyline taper as instructed. Follow up in 4 weeks.   Analgesic Rebound Headache An analgesic rebound headache, sometimes called a medication overuse headache, is a headache that comes after pain medicine (analgesic) taken to treat the original (primary) headache has worn off. Any type of primary headache can return as a rebound headache if a person regularly takes analgesics more than three times a week to treat it. The types of primary headaches that are commonly associated with rebound headaches include:  Migraines.  Headaches that arise from tense muscles in the head and neck area (tension headaches).  Headaches that develop and happen again (recur) on one side of the head and around the eye (cluster headaches).  If rebound headaches continue, they become chronic daily headaches. What are the causes? This condition may be caused by frequent use of:  Over-the-counter medicines such as aspirin, ibuprofen, and acetaminophen.  Sinus relief medicines and other medicines that contain caffeine.  Narcotic pain medicines such as codeine and oxycodone.  What are the signs or symptoms? The symptoms of a rebound headache are the same as the symptoms of the original headache. Some of the symptoms of specific types of headaches include: Migraine headache  Pulsing or throbbing pain on one or both sides of the head.  Severe pain that interferes with daily activities.  Pain that is worsened by physical activity.  Nausea, vomiting, or both.  Pain with exposure to bright light, loud noises, or strong smells.  General sensitivity to bright light, loud noises, or strong smells.  Visual changes.  Numbness of one or both arms. Tension headache  Pressure around the head.  Dull, aching head pain.  Pain felt over the front and sides of the head.  Tenderness in the muscles of the head, neck, and shoulders. Cluster headache  Severe pain that begins in or around one eye or  temple.  Redness and tearing in the eye on the same side as the pain.  Droopy or swollen eyelid.  One-sided head pain.  Nausea.  Runny nose.  Sweaty, pale facial skin.  Restlessness. How is this diagnosed? This condition is diagnosed by:  Reviewing your medical history. This includes the nature of your primary headaches.  Reviewing the types of pain medicines that you have been using to treat your headaches and how often you take them.  How is this treated? This condition may be treated or managed by:  Discontinuing frequent use of the analgesic medicine. Doing this may worsen your headaches at first, but the pain should eventually become more manageable, less frequent, and less severe.  Seeing a headache specialist. He or she may be able to help you manage your headaches and help make sure there is not another cause of the headaches.  Using methods of stress relief, such as acupuncture, counseling, biofeedback, and massage. Talk with your health care provider about which methods might be good for you.  Follow these instructions at home:  Take over-the-counter and prescription medicines only as told by your health care provider.  Stop the repeated use of pain medicine as told by your health care provider. Stopping can be difficult. Carefully follow instructions from your health care provider.  Avoid triggers that are known to cause your primary headaches.  Keep all follow-up visits as told by your health care provider. This is important. Contact a health care provider if:  You continue to experience headaches after following treatments that your health care provider recommended. Get help right away if:  You  develop new headache pain.  You develop headache pain that is different than what you have experienced in the past.  You develop numbness or tingling in your arms or legs.  You develop changes in your speech or vision. This information is not intended to replace  advice given to you by your health care provider. Make sure you discuss any questions you have with your health care provider. Document Released: 10/02/2003 Document Revised: 01/30/2016 Document Reviewed: 12/15/2015 Elsevier Interactive Patient Education  [employer].

## 2018-02-03 NOTE — Progress Notes (Signed)
Kimberly FinesBrittany P Burnstein , 1996/01/21, 22 y.o., female MRN: [id number] Patient Care Team    Relationship Specialty Notifications Start End  Natalia LeatherwoodKuneff, Gemayel Mascio A, DO PCP - General Family Medicine  03/27/15     Chief Complaint  Patient presents with  . Follow-up    Headaches     Subjective:  GrenadaBrittany P Fuentes is a 22 y.o. female present to follow up in her headaches. She did not get any additional relief from steroid or flexeril. She has seen her eye doctor and did need increased script and her stigmatism is worsening. She did download the headache app and has been keeping track of her headaches. She does not feel her headaches have changed since getting her new glasses, but they are more comfortable.  Prior note:  Pt presents for an OV with complaints of increased headaches of 2 month duration.  Associated symptoms include mild vertigo like symptoms. She reports her headaches are daily, she wakes with them. Are not intense enough to miss work. Her eye exam is overdue and she has an appt tomorrow to check her prescription glasses. She denies nausea, vomit, visual changes or syncope. She reports the discomfort is above her eyes. She denies increased stress. She is sleeping well. She was seen a few weeks ago by Dr. Milinda CaveMcGowen and toradol injection was provided and MRI ordered. MRI was without acute cause for headaches. She has refrained from taking the Excedrin. She also stopped caffeine use 2 months ago. She has a Mirena, which has been in place for about 2 years.  [facility] 08/22/2017 04/14/2016  Decreased Interest 0 0  Down, Depressed, Hopeless 0 0  PHQ - 2 Score 0 0    Allergies  Allergen Reactions  . Cefazolin Hives  . Penicillins Hives  . Hydrocodone Itching    Facial itching when taking after wisdom teeth extraction   Social History   Tobacco Use  . Smoking status: Passive Smoke Exposure - Never Smoker  . Smokeless tobacco: Never Used  Substance Use Topics  . Alcohol  use: No   Past Medical History:  Diagnosis Date  . Allergy   . UTI (lower urinary tract infection)    Past Surgical History:  Procedure Laterality Date  . TONSILLECTOMY AND ADENOIDECTOMY  2000  . WISDOM TOOTH EXTRACTION  2012   Family History  Problem Relation Age of Onset  . Miscarriages / Stillbirths Mother    Allergies as of 02/03/2018      Reactions   Cefazolin Hives   Penicillins Hives   Hydrocodone Itching   Facial itching when taking after wisdom teeth extraction      Medication List        Accurate as of 02/03/18 11:34 AM. Always use your most recent med list.          amitriptyline 10 MG tablet Commonly known as:  ELAVIL Take 1 tablet (10 mg total) by mouth at bedtime for 7 days, THEN 2 tablets (20 mg total) at bedtime for 23 days. Start taking on:  02/03/2018   aspirin-acetaminophen-caffeine 250-250-65 MG tablet Commonly known as:  EXCEDRIN MIGRAINE Take 1 tablet by mouth every 6 (six) hours as needed for headache.   levonorgestrel 20 MCG/24HR IUD Commonly known as:  MIRENA 1 each by Intrauterine route once.       All past medical history, surgical history, allergies, family history, immunizations andmedications were updated in the EMR today and reviewed under the history and medication portions of their  EMR.     ROS: Negative, with the exception of above mentioned in HPI   Objective:  BP 122/73 (BP Location: Right Arm, Patient Position: Sitting, Cuff Size: Normal)   Pulse 92   Resp 16   Ht 5\' 2"  (1.575 m)   Wt 159 lb (72.1 kg)   SpO2 96%   BMI 29.08 kg/m  Body mass index is 29.08 kg/m. Gen: Afebrile. No acute distress. nontoxic HENT: AT. [place].  MMM.  Eyes:Pupils Equal Round Reactive to light, Extraocular movements intact,  Conjunctiva without redness, discharge or icterus. Neuro: Normal gait. PERLA. EOMi. Alert. Oriented x3 Psych: Normal affect, dress and demeanor. Normal speech. Normal thought content and judgment.  No exam data present No  results found. No results found for this or any previous visit (from the past 24 hour(s)).  Assessment/Plan: Kimberly Fuentes is a 22 y.o. female present for OV for  Daily headache/tension headache/chronic headaches - No improvement after steroid shot and flexeril.  - MRI reassuring. - headache app: 2-4 headaches a week. Last 1-6 hours. Mostly located over eyes and right temporal area.  - continue mag, b12 - discussed daily med to control headaches given how many she is having per week. She is agreeable to medication and chose the elavil in hopes it will also help her with sleep. Tapering instructions discussed--> elavil 10-20 mg taper.  - f/u 4 weeks and continue to taler up on Elavil if working.     Reviewed expectations re: course of current medical issues.  Discussed self-management of symptoms.  Outlined signs and symptoms indicating need for more acute intervention.  Patient verbalized understanding and all questions were answered.  Patient received an After-Visit Summary.    No orders of the defined types were placed in this encounter.    Note is dictated utilizing voice recognition software. Although note has been proof read prior to signing, occasional typographical errors still can be missed. If any questions arise, please do not hesitate to call for verification.   electronically signed by:  Felix Pacini, DO  [facility] [place]

## 2018-03-17 ENCOUNTER — Ambulatory Visit: Payer: Commercial Managed Care - PPO | Admitting: Family Medicine

## 2018-09-20 ENCOUNTER — Ambulatory Visit (INDEPENDENT_AMBULATORY_CARE_PROVIDER_SITE_OTHER): Payer: Commercial Managed Care - PPO | Admitting: Physician Assistant

## 2018-09-20 ENCOUNTER — Encounter: Payer: Self-pay | Admitting: Physician Assistant

## 2018-09-20 ENCOUNTER — Other Ambulatory Visit: Payer: Self-pay

## 2018-09-20 VITALS — BP 118/70 | HR 83 | Temp 98.7°F | Resp 16 | Ht 62.0 in | Wt 167.0 lb

## 2018-09-20 DIAGNOSIS — J101 Influenza due to other identified influenza virus with other respiratory manifestations: Secondary | ICD-10-CM

## 2018-09-20 LAB — POC INFLUENZA A&B (BINAX/QUICKVUE)
Influenza A, POC: NEGATIVE
Influenza B, POC: POSITIVE — AB

## 2018-09-20 MED ORDER — OSELTAMIVIR PHOSPHATE 75 MG PO CAPS: 75.0000 mg | ORAL_CAPSULE | Freq: Two times a day (BID) | ORAL | 0 refills | Status: DC

## 2018-09-20 NOTE — Patient Instructions (Signed)
Please keep hydrated and get plenty of rest. Take the Tamiflu as directed. I recommend some over-the-counter Theraflu for symptoms.  Start a saline nasal rinse.  No work until you are fever free for 24 hours without medication and feel better.   Influenza, Adult Influenza is also called "the flu." It is an infection in the lungs, nose, and throat (respiratory tract). It is caused by a virus. The flu causes symptoms that are similar to symptoms of a cold. It also causes a high fever and body aches. The flu spreads easily from person to person (is contagious). Getting a flu shot (influenza vaccination) every year is the best way to prevent the flu. What are the causes? This condition is caused by the influenza virus. You can get the virus by:  Breathing in droplets that are in the air from the cough or sneeze of a person who has the virus.  Touching something that has the virus on it (is contaminated) and then touching your mouth, nose, or eyes. What increases the risk? Certain things may make you more likely to get the flu. These include:  Not washing your hands often.  Having close contact with many people during cold and flu season.  Touching your mouth, eyes, or nose without first washing your hands.  Not getting a flu shot every year. You may have a higher risk for the flu, along with serious problems such as a lung infection (pneumonia), if you:  Are older than 65.  Are pregnant.  Have a weakened disease-fighting system (immune system) because of a disease or taking certain medicines.  Have a long-term (chronic) illness, such as: ? Heart, kidney, or lung disease. ? Diabetes. ? Asthma.  Have a liver disorder.  Are very overweight (morbidly obese).  Have anemia. This is a condition that affects your red blood cells. What are the signs or symptoms? Symptoms usually begin suddenly and last 4-14 days. They may include:  Fever and chills.  Headaches, body aches, or  muscle aches.  Sore throat.  Cough.  Runny or stuffy (congested) nose.  Chest discomfort.  Not wanting to eat as much as normal (poor appetite).  Weakness or feeling tired (fatigue).  Dizziness.  Feeling sick to your stomach (nauseous) or throwing up (vomiting). How is this treated? If the flu is found early, you can be treated with medicine that can help reduce how bad the illness is and how long it lasts (antiviral medicine). This may be given by mouth (orally) or through an IV tube. Taking care of yourself at home can help your symptoms get better. Your doctor may suggest:  Taking over-the-counter medicines.  Drinking plenty of fluids. The flu often goes away on its own. If you have very bad symptoms or other problems, you may be treated in a hospital. Follow these instructions at home:     Activity  Rest as needed. Get plenty of sleep.  Stay home from work or school as told by your doctor. ? Do not leave home until you do not have a fever for 24 hours without taking medicine. ? Leave home only to visit your doctor. Eating and drinking  Take an ORS (oral rehydration solution). This is a drink that is sold at pharmacies and stores.  Drink enough fluid to keep your pee (urine) pale yellow.  Drink clear fluids in small amounts as you are able. Clear fluids include: ? Water. ? Ice chips. ? Fruit juice that has water added (diluted fruit juice). ?  Low-calorie sports drinks.  Eat bland, easy-to-digest foods in small amounts as you are able. These foods include: ? Bananas. ? Applesauce. ? Rice. ? Lean meats. ? Toast. ? Crackers.  Do not eat or drink: ? Fluids that have a lot of sugar or caffeine. ? Alcohol. ? Spicy or fatty foods. General instructions  Take over-the-counter and prescription medicines only as told by your doctor.  Use a cool mist humidifier to add moisture to the air in your home. This can make it easier for you to breathe.  Cover your  mouth and nose when you cough or sneeze.  Wash your hands with soap and water often, especially after you cough or sneeze. If you cannot use soap and water, use alcohol-based hand sanitizer.  Keep all follow-up visits as told by your doctor. This is important. How is this prevented?   Get a flu shot every year. You may get the flu shot in late summer, fall, or winter. Ask your doctor when you should get your flu shot.  Avoid contact with people who are sick during fall and winter (cold and flu season). Contact a doctor if:  You get new symptoms.  You have: ? Chest pain. ? Watery poop (diarrhea). ? A fever.  Your cough gets worse.  You start to have more mucus.  You feel sick to your stomach.  You throw up. Get help right away if you:  Have shortness of breath.  Have trouble breathing.  Have skin or nails that turn a bluish color.  Have very bad pain or stiffness in your neck.  Get a sudden headache.  Get sudden pain in your face or ear.  Cannot eat or drink without throwing up. Summary  Influenza ("the flu") is an infection in the lungs, nose, and throat. It is caused by a virus.  Take over-the-counter and prescription medicines only as told by your doctor.  Getting a flu shot every year is the best way to avoid getting the flu. This information is not intended to replace advice given to you by your health care provider. Make sure you discuss any questions you have with your health care provider. Document Released: 04/20/2008 Document Revised: 12/28/2017 Document Reviewed: 12/28/2017 Elsevier Interactive Patient Education  2019 [employer].

## 2018-09-20 NOTE — Progress Notes (Signed)
Patient presents to clinic today c/o 1 day of intermittent fever associated with chills, aches, fatigue and nasal congestion. Denies recent travel or known sick contact. 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 She has not taken anything recently for her symptoms.   Past Medical History:  Diagnosis Date  . Allergy   . UTI (lower urinary tract infection)     Current Outpatient Medications on File Prior to Visit  Medication Sig Dispense Refill  . levonorgestrel (MIRENA) 20 MCG/24HR IUD 1 each by Intrauterine route once.     No current facility-administered medications on file prior to visit.     Allergies  Allergen Reactions  . Cefazolin Hives  . Penicillins Hives  . Hydrocodone Itching    Facial itching when taking after wisdom teeth extraction    Family History  Problem Relation Age of Onset  . Miscarriages / [place] Mother     Social History   Socioeconomic History  . Marital status: Single    Spouse name: Not on file  . Number of children: Not on file  . Years of education: Not on file  . Highest education level: Not on file  Occupational History  . Not on file  Social Needs  . Financial resource strain: Not on file  . Food insecurity:    Worry: Not on file    Inability: Not on file  . Transportation needs:    Medical: Not on file    Non-medical: Not on file  Tobacco Use  . Smoking status: Passive Smoke Exposure - Never Smoker  . Smokeless tobacco: Never Used  Substance and Sexual Activity  . Alcohol use: No  . Drug use: No  . Sexual activity: Yes    Birth control/protection: None, Condom, Injection    Comment: depo  Lifestyle  . Physical activity:    Days per week: Not on file    Minutes per session: Not on file  . Stress: Not on file  Relationships  . Social connections:    Talks on phone: Not on file    Gets together: Not on file    Attends religious service: Not on file    Active  member of club or organization: Not on file    Attends meetings of clubs or organizations: Not on file    Relationship status: Not on file  Other Topics Concern  . Not on file  Social History Narrative   Wears seat belt, wears bike helmet. Feels safe in her relationships. Works at [employer].    Designer, television/film set, applying for surgical tech   - [employer]   - No etoh, drugs or smoker.    - In a relationship   - Patient has a dental home, with every 6 month check ups.   Review of Systems - See HPI.  All other ROS are negative.  BP 118/70   Pulse 83   Temp 98.7 F (37.1 C) (Oral)   Resp 16   Ht 5\' 2"  (1.575 m)   Wt 167 lb (75.8 kg)   SpO2 98%   BMI 30.54 kg/m   Physical Exam Vitals signs reviewed.  Constitutional:      Appearance: Normal appearance.  HENT:     Head: Normocephalic and atraumatic.     Right Ear: Tympanic membrane normal.     Left Ear: Tympanic membrane normal.     Nose: Rhinorrhea present.     Mouth/Throat:  Mouth: Mucous membranes are moist.  Eyes:     Pupils: Pupils are equal, round, and reactive to light.  Neck:     Musculoskeletal: Neck supple.  Cardiovascular:     Rate and Rhythm: Normal rate and regular rhythm.     Heart sounds: Normal heart sounds.  Pulmonary:     Effort: Pulmonary effort is normal.     Breath sounds: Normal breath sounds.  Neurological:     General: No focal deficit present.     Mental Status: She is alert and oriented to person, place, and time.     Recent Results (from the past 2160 hour(s))  POC Influenza A&B(BINAX/QUICKVUE)     Status: Abnormal   Collection Time: 09/20/18  9:51 AM  Result Value Ref Range   Influenza A, POC Negative Negative   Influenza B, POC Positive (A) Negative    Assessment/Plan: 1. Influenza B Start Tamiflu. Supportive measures and OTC medications reviewed with patient. Strict return precautions discussed. - POC Influenza A&B(BINAX/QUICKVUE) - oseltamivir (TAMIFLU) 75 MG capsule; Take 1  capsule (75 mg total) by mouth 2 (two) times daily.  Dispense: 10 capsule; Refill: 0   Piedad Climes, PA-C

## 2018-10-11 IMAGING — MR MR HEAD WO/W CM
12 series · 48 of 48 positions shown · IV contrast (multihance)
Comparison: None.

ADDENDUM:
In addition to the initially described findings, note is made of a 7
mm simple T2 hyperintense nonenhancing cyst within the pineal gland.
No significant regional mass effect. This is felt to be incidental
in nature and of no clinical significance.
CLINICAL DATA: Initial evaluation for chronic headaches, new onset
vertigo.

EXAM:
MRI HEAD WITHOUT AND WITH CONTRAST
TECHNIQUE: Multiplanar, multiecho pulse sequences of the brain and surrounding
structures were obtained without and with intravenous contrast.
CONTRAST:  15mL MULTIHANCE GADOBENATE DIMEGLUMINE 529 MG/ML IV SOLN

[Series 5: T1 · sagittal · 4.0mm · 0.75mm/px · 2 of 31 slices shown (1 of 3)]
[im 1/31]
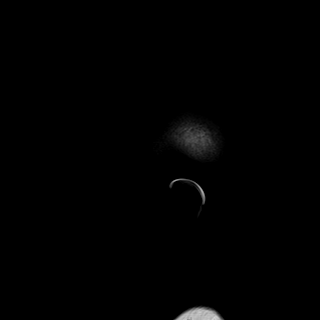
[im 31/31]
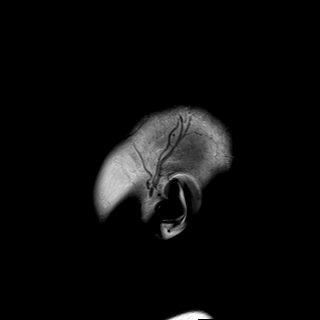

[Series 6: DWI · axial · 3.0mm · 1.44mm/px · z∈[-53,+82]mm · 5 of 84 slices shown (1 of 4)]
[im 1/84]
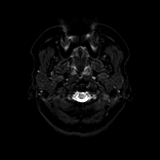
[im 21/84]
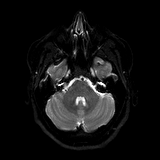
[im 42/84]
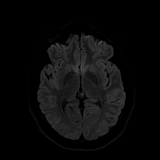
[im 63/84]
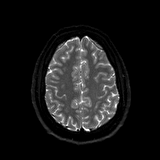
[im 84/84]
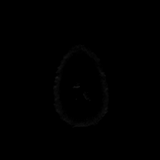

[Series 7: DWI · axial · 3.0mm · 1.44mm/px · z∈[-53,+82]mm · 3 of 42 slices shown (2 of 4)]
[im 1/42]
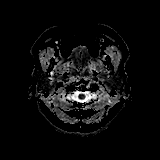
[im 21/42]
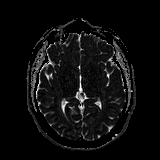
[im 42/42]
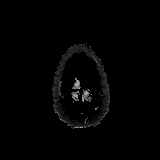

[Series 8: DWI · coronal · 5.0mm · 1.44mm/px · 4 of 60 slices shown (3 of 4)]
[im 1/60]
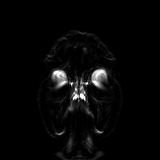
[im 20/60]
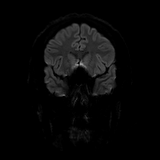
[im 40/60]
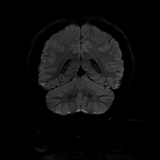
[im 60/60]
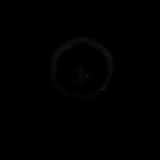

[Series 9: DWI · coronal · 5.0mm · 1.44mm/px · 2 of 30 slices shown (4 of 4)]
[im 1/30]
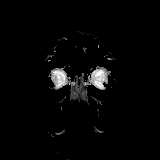
[im 30/30]
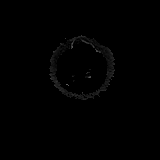

[Series 10: T2 · axial · 4.0mm · 0.36mm/px · z∈[-57,+78]mm · 2 of 27 slices shown]
[im 1/27]
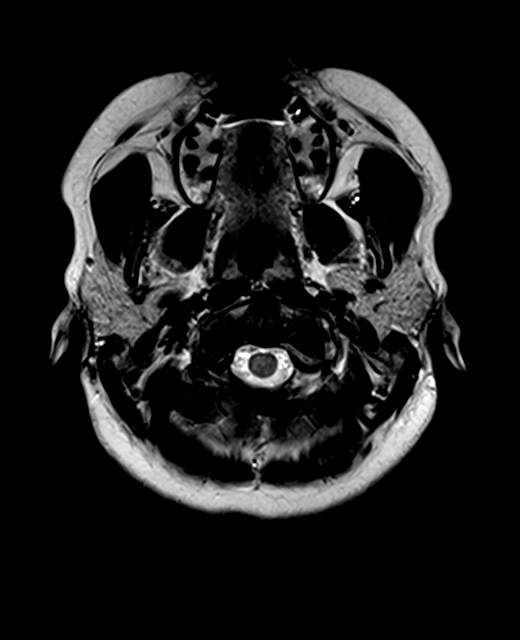
[im 27/27]
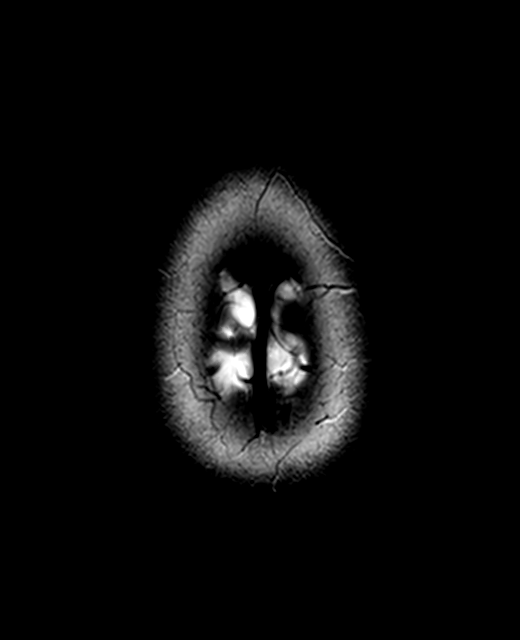

[Series 11: FLAIR · axial · 3.0mm · 0.72mm/px · z∈[-63,+86]mm · 2 of 26 slices shown]
[im 1/26]
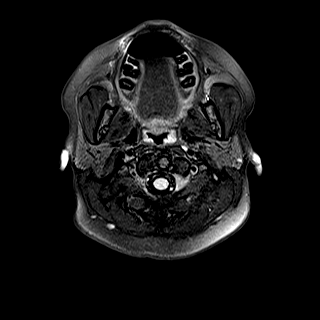
[im 26/26]
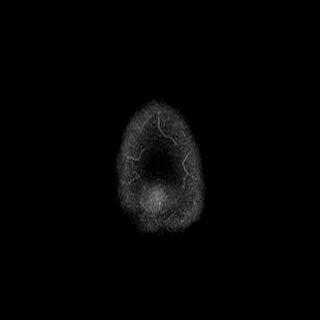

[Series 13: swi_images · axial · 1.5mm · 0.90mm/px · z∈[-60,+82]mm · 6 of 96 slices shown]
[im 1/96]
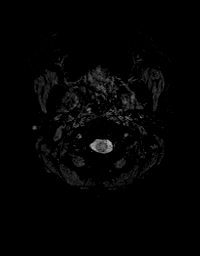
[im 20/96]
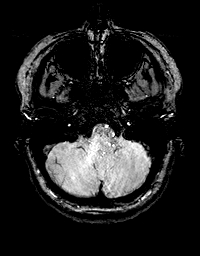
[im 39/96]
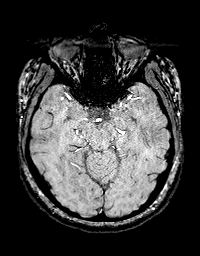
[im 58/96]
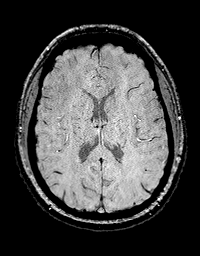
[im 77/96]
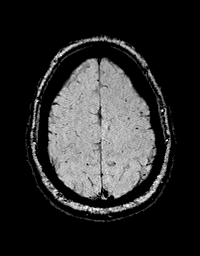
[im 96/96]
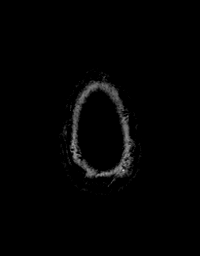

[Series 14: T1 · axial · 1.0mm · 0.90mm/px · z∈[-61,+82]mm · 9 of 144 slices shown (2 of 3)]
[im 1/144]
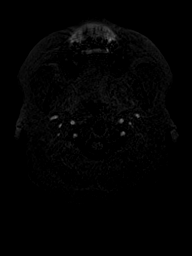
[im 18/144]
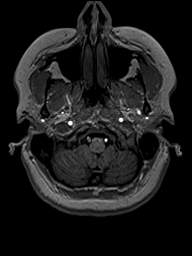
[im 36/144]
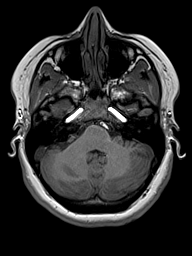
[im 54/144]
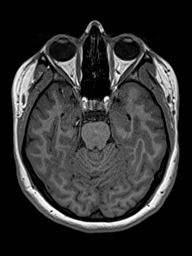
[im 72/144]
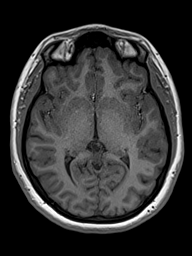
[im 90/144]
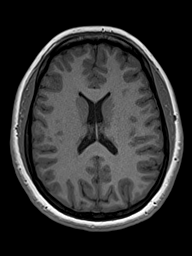
[im 108/144]
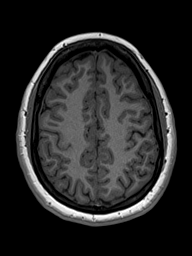
[im 126/144]
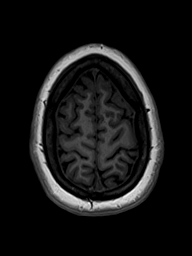
[im 144/144]
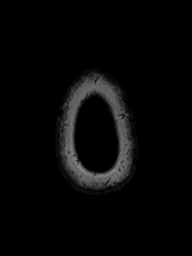

[Series 15: T2 post-contrast · coronal · 4.0mm · 0.36mm/px · 2 of 32 slices shown]
[im 1/32]
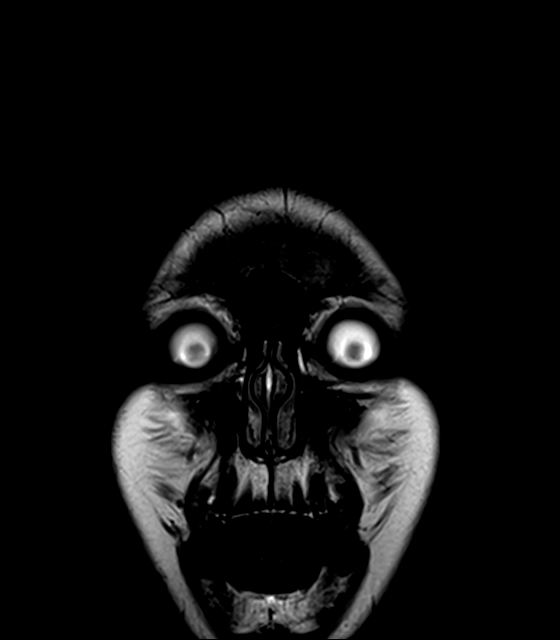
[im 32/32]
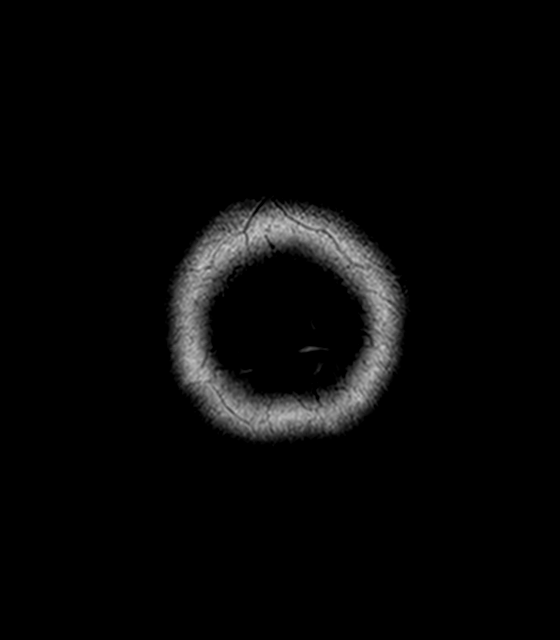

[Series 16: T1 · axial · 1.0mm · 0.90mm/px · z∈[-61,+82]mm · 9 of 144 slices shown (3 of 3)]
[im 1/144]
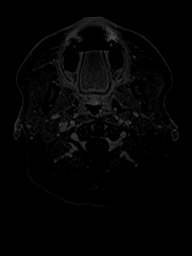
[im 18/144]
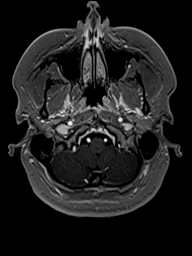
[im 36/144]
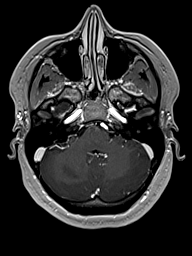
[im 54/144]
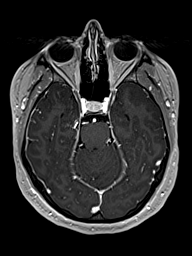
[im 72/144]
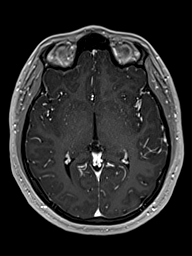
[im 90/144]
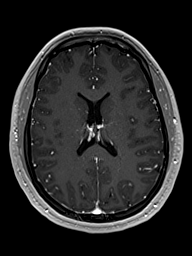
[im 108/144]
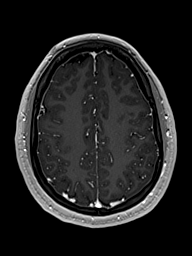
[im 126/144]
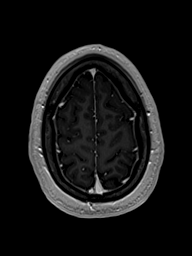
[im 144/144]
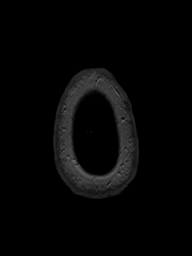

[Series 17: T1 post-contrast · coronal · 4.0mm · 0.72mm/px · 2 of 32 slices shown]
[im 1/32]
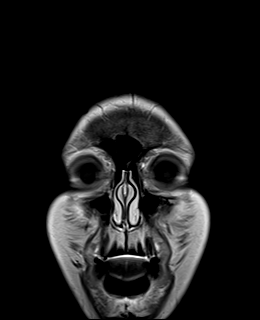
[im 32/32]
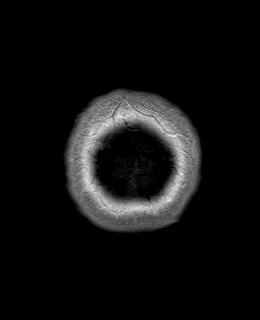

[48 of 48 positions shown; findings below may reference images not displayed]

FINDINGS: Brain: Cerebral volume within normal limits for patient age. No
focal parenchymal signal abnormality identified. No abnormal foci of
restricted diffusion to suggest acute or subacute ischemia.
Gray-white matter differentiation well maintained. No
encephalomalacia to suggest chronic infarction. No foci of
susceptibility artifact to suggest acute or chronic intracranial
hemorrhage.

Mass lesion, midline shift or mass effect. No hydrocephalus. No
extra-axial fluid collection. Major dural sinuses are grossly
patent.

Pituitary gland and suprasellar region are normal. Midline
structures intact and normal. Graph

No abnormal enhancement. Incidental note made of a tiny DVA within
the anterior right frontal lobe.

Vascular: Major intracranial vascular flow voids well maintained and
normal in appearance.

Skull and upper cervical spine: Craniocervical junction normal.
Visualized upper cervical spine within normal limits. Bone marrow
signal intensity normal. No scalp soft tissue abnormality.

Sinuses/Orbits: Globes and orbital soft tissues within normal
limits. Paranasal sinuses are clear. No mastoid effusion. Inner ear
structures normal.

Other: None.
IMPRESSION: Normal MRI of the brain.

## 2018-11-07 ENCOUNTER — Telehealth: Payer: Self-pay

## 2018-11-07 NOTE — Telephone Encounter (Signed)
Spoke with patient and advised we do not offer testing and to stay home for 14 days.  Copied from CRM [id number]. Topic: Quick Communication - See Telephone Encounter >> Nov 07, 2018  9:27 AM Herby Abraham C wrote: CRM for notification. See Telephone encounter for: 11/07/18.  Pt called in to request a order to be tested for Covid. 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 Pt says that she is now having symptoms.   Temp is ranging from 98.1-99.1  Sore throat  Fatique  Chills  Cough  Congestion    Pt would like further assistance

## 2019-07-30 ENCOUNTER — Ambulatory Visit (INDEPENDENT_AMBULATORY_CARE_PROVIDER_SITE_OTHER): Payer: Commercial Managed Care - PPO

## 2019-07-30 ENCOUNTER — Other Ambulatory Visit: Payer: Self-pay

## 2019-07-30 ENCOUNTER — Ambulatory Visit (INDEPENDENT_AMBULATORY_CARE_PROVIDER_SITE_OTHER): Payer: Commercial Managed Care - PPO | Admitting: Podiatry

## 2019-07-30 DIAGNOSIS — S99922A Unspecified injury of left foot, initial encounter: Secondary | ICD-10-CM

## 2019-07-30 DIAGNOSIS — M779 Enthesopathy, unspecified: Secondary | ICD-10-CM

## 2019-07-30 DIAGNOSIS — T148XXA Other injury of unspecified body region, initial encounter: Secondary | ICD-10-CM

## 2019-07-30 MED ORDER — MELOXICAM 7.5 MG PO TABS: 7.5000 mg | ORAL_TABLET | Freq: Every day | ORAL | 0 refills | Status: DC

## 2019-07-30 NOTE — Addendum Note (Signed)
Addended by: Kristian Covey on: 07/30/2019 09:15 AM   Modules accepted: Orders

## 2019-07-30 NOTE — Progress Notes (Signed)
Subjective: 24 year old female presents the office for concerns of a left foot injury that happened on Friday.  She states that she was getting in her car and she twisted her foot resulting in pain to the lateral aspect of her foot.  She states it does hurt to put pressure and walk on her foot.  She said no recent treatment.  She is not sure if there is any swelling.  No other concerns. Denies any systemic complaints such as fevers, chills, nausea, vomiting. No acute changes since last appointment, and no other complaints at this time.   Objective: AAO x3, NAD DP/PT pulses palpable bilaterally, CRT less than 3 seconds There is tenderness to palpation of the fifth metatarsal base on the left foot and along the distal portion of the peroneal tendon.  Minimal discomfort of the fifth metatarsal head.  Mild swelling to the lateral aspect of the foot.  No other areas of tenderness.  Flexor, extensor tendons appear to be intact. No pain with calf compression, swelling, warmth, erythema  Assessment: Left foot injury, insertional peroneal tendinitis with concern for possible avulsion fracture from calcaneocuboid joint  Plan: -All treatment options discussed with the patient including all alternatives, risks, complications.  -X-rays obtained and reviewed.  No obvious signs of acute fracture except there is a shadow at the calcaneocuboid joint concerning for possible avulsion fracture. -Given the pain she is having recommend immobilization in a cam boot.  Ice elevation.  Prescribed meloxicam. -Patient encouraged to call the office with any questions, concerns, change in symptoms.   If symptoms continue repeat x-rays next appointment   Vivi Barrack DPM

## 2019-08-13 ENCOUNTER — Ambulatory Visit: Payer: Commercial Managed Care - PPO

## 2019-08-13 ENCOUNTER — Ambulatory Visit (INDEPENDENT_AMBULATORY_CARE_PROVIDER_SITE_OTHER): Payer: Commercial Managed Care - PPO | Admitting: Podiatry

## 2019-08-13 ENCOUNTER — Other Ambulatory Visit: Payer: Self-pay | Admitting: Podiatry

## 2019-08-13 ENCOUNTER — Other Ambulatory Visit: Payer: Self-pay

## 2019-08-13 ENCOUNTER — Encounter: Payer: Self-pay | Admitting: Podiatry

## 2019-08-13 DIAGNOSIS — T148XXA Other injury of unspecified body region, initial encounter: Secondary | ICD-10-CM | POA: Diagnosis not present

## 2019-08-13 DIAGNOSIS — S92215A Nondisplaced fracture of cuboid bone of left foot, initial encounter for closed fracture: Secondary | ICD-10-CM | POA: Diagnosis not present

## 2019-08-13 NOTE — Progress Notes (Signed)
She presents today for follow-up of an injury she sustained July 30, 2019 as she was getting into her car she rolled her foot.  She states that she has been religious about wearing her boot until just a day or 2 ago she states without the boot it really hurts.  Objective: Vital signs are stable alert oriented x3.  There is minimal edema lateral aspect of the left foot there is no erythema cellulitis drainage or odor pulses are palpable.  No open lesions or wounds.  Peroneal tendons are intact and nontender.  They are functional.  She does have positive pain on palpation of the cuboid bone no problem with the base of the fifth metatarsal or of the calcaneus.  Radiographs taken today do demonstrate what appears to be a crack in the proximal aspect of the cuboid bone into the subcortical bone proximally.  Assessment: Fracture cuboid nondisplaced noncomminuted.  Plan: Continue to wear the boot for the next 4 weeks I will follow-up with her at that time for another set of x-rays.

## 2019-08-20 ENCOUNTER — Ambulatory Visit: Payer: Commercial Managed Care - PPO | Admitting: Podiatry

## 2019-09-13 ENCOUNTER — Ambulatory Visit: Payer: Commercial Managed Care - PPO | Admitting: Podiatry

## 2022-04-15 ENCOUNTER — Ambulatory Visit
Admission: EM | Admit: 2022-04-15 | Discharge: 2022-04-15 | Disposition: A | Discharge status: Home / Self Care | Payer: Commercial Managed Care - PPO | Attending: Urgent Care | Admitting: Urgent Care

## 2022-04-15 DIAGNOSIS — Z20822 Contact with and (suspected) exposure to covid-19: Secondary | ICD-10-CM | POA: Insufficient documentation

## 2022-04-15 DIAGNOSIS — R070 Pain in throat: Secondary | ICD-10-CM | POA: Insufficient documentation

## 2022-04-15 DIAGNOSIS — B349 Viral infection, unspecified: Secondary | ICD-10-CM | POA: Insufficient documentation

## 2022-04-15 DIAGNOSIS — R052 Subacute cough: Secondary | ICD-10-CM | POA: Insufficient documentation

## 2022-04-15 DIAGNOSIS — J3489 Other specified disorders of nose and nasal sinuses: Secondary | ICD-10-CM | POA: Insufficient documentation

## 2022-04-15 DIAGNOSIS — J020 Streptococcal pharyngitis: Secondary | ICD-10-CM | POA: Insufficient documentation

## 2022-04-15 LAB — RESP PANEL BY RT-PCR (FLU A&B, COVID) ARPGX2
Influenza A by PCR: NEGATIVE
Influenza B by PCR: NEGATIVE
SARS Coronavirus 2 by RT PCR: NEGATIVE

## 2022-04-15 LAB — POCT RAPID STREP A (OFFICE): Rapid Strep A Screen: NEGATIVE

## 2022-04-15 MED ORDER — BENZONATATE 100 MG PO CAPS: 100.0000 mg | ORAL_CAPSULE | Freq: Three times a day (TID) | ORAL | 0 refills | Status: DC | PRN

## 2022-04-15 MED ORDER — LEVOCETIRIZINE DIHYDROCHLORIDE 5 MG PO TABS: 5.0000 mg | ORAL_TABLET | Freq: Every evening | ORAL | 0 refills | Status: DC

## 2022-04-15 MED ORDER — PSEUDOEPHEDRINE HCL 60 MG PO TABS: 60.0000 mg | ORAL_TABLET | Freq: Three times a day (TID) | ORAL | 0 refills | Status: DC | PRN

## 2022-04-15 NOTE — Discharge Instructions (Addendum)
We will notify you of your test results as they arrive and may take between about 24 hours.  I encourage you to sign up for MyChart if you have not already done so as this can be the easiest way for us to communicate results to you online or through a phone app.  Generally, we only contact you if it is a positive test result.  In the meantime, if you develop worsening symptoms including fever, chest pain, shortness of breath despite our current treatment plan then please report to the emergency room as this may be a sign of worsening status from possible viral infection.  Otherwise, we will manage this as a viral syndrome. For sore throat or cough try using a honey-based tea. Use 3 teaspoons of honey with juice squeezed from half lemon. Place shaved pieces of ginger into 1/2-1 cup of water and warm over stove top. Then mix the ingredients and repeat every 4 hours as needed. Please take Tylenol 500mg-650mg every 6 hours for aches and pains, fevers. Hydrate very well with at least 2 liters of water. Eat light meals such as soups to replenish electrolytes and soft fruits, veggies. Start an antihistamine like Xyzal for postnasal drainage, sinus congestion.  You can take this together with pseudoephedrine (Sudafed) at a dose of 60 mg 2-3 times a day as needed for the same kind of congestion.  Use the cough medications as needed.   

## 2022-04-15 NOTE — ED Provider Notes (Signed)
[facility]  Note:  This document was prepared using Conservation officer, historic buildings and may include unintentional dictation errors.  MRN: [id number] DOB: [birth year]  Subjective:   Kimberly Fuentes is a 26 y.o. female presenting for 1 day history of acute onset throat pain, body aches, congestion, headache.  Patient was seen through health at work and has a COVID test pending but wanted to make sure she got checked for flu and strep with [place] as well.  She does not mind a COVID test through our clinic either.  No overt chest pain, shortness of breath or wheezing.  She does have a history of allergic rhinitis.  No history of asthma or respiratory disorders.  No current facility-administered medications for this encounter.  Current Outpatient Medications:    levonorgestrel (MIRENA) 20 MCG/24HR IUD, 1 each by Intrauterine route once., Disp: , Rfl:    meloxicam (MOBIC) 7.5 MG tablet, Take 1 tablet (7.5 mg total) by mouth daily., Disp: 14 tablet, Rfl: 0   Allergies  Allergen Reactions   Cefazolin Hives   Penicillins Hives   Hydrocodone Itching    Facial itching when taking after wisdom teeth extraction    Past Medical History:  Diagnosis Date   Allergy    UTI (lower urinary tract infection)      Past Surgical History:  Procedure Laterality Date   TONSILLECTOMY AND ADENOIDECTOMY  2000   WISDOM TOOTH EXTRACTION  2012    Family History  Problem Relation Age of Onset   Miscarriages / [place] Mother     Social History   Tobacco Use   Smoking status: Never    Passive exposure: Yes   Smokeless tobacco: Never   Tobacco comments:    BF smokes outside   Vaping Use   Vaping Use: Never used  Substance Use Topics   Alcohol use: Yes    Comment: social drinker   Drug use: No    ROS   Objective:   Vitals: BP 117/82 (BP Location: Right Arm)   Pulse 71   Temp 97.9 F (36.6 C) (Oral)   Resp 16   SpO2 98%   Physical Exam Constitutional:       General: She is not in acute distress.    Appearance: Normal appearance. She is well-developed. She is not ill-appearing, toxic-appearing or diaphoretic.  HENT:     Head: Normocephalic and atraumatic.     Nose: Nose normal.     Mouth/Throat:     Mouth: Mucous membranes are moist.     Pharynx: No pharyngeal swelling, oropharyngeal exudate, posterior oropharyngeal erythema or uvula swelling.     Tonsils: No tonsillar exudate or tonsillar abscesses. 0 on the right. 0 on the left.  Eyes:     General: No scleral icterus.       Right eye: No discharge.        Left eye: No discharge.     Extraocular Movements: Extraocular movements intact.  Cardiovascular:     Rate and Rhythm: Normal rate and regular rhythm.     Heart sounds: Normal heart sounds. No murmur heard.    No friction rub. No gallop.  Pulmonary:     Effort: Pulmonary effort is normal. No respiratory distress.     Breath sounds: No stridor. No wheezing, rhonchi or rales.  Chest:     Chest wall: No tenderness.  Skin:    General: Skin is warm and dry.  Neurological:     General: No focal  deficit present.     Mental Status: She is alert and oriented to person, place, and time.  Psychiatric:        Mood and Affect: Mood normal.        Behavior: Behavior normal.     Results for orders placed or performed during the hospital encounter of 04/15/22 (from the past 24 hour(s))  POCT rapid strep A     Status: None   Collection Time: 04/15/22 12:43 PM  Result Value Ref Range   Rapid Strep A Screen Negative Negative    Assessment and Plan :   PDMP not reviewed this encounter.  1. Acute viral syndrome   2. Streptococcal sore throat   3. Throat pain   4. Sinus drainage   5. Subacute cough    Strep culture, COVID and flu test pending.  We will otherwise manage for viral upper respiratory infection.  Physical exam findings reassuring and vital signs stable for discharge. Advised supportive care, offered symptomatic relief.  Deferred imaging given clear cardiopulmonary exam, hemodynamically stable vital signs. Counseled patient on potential for adverse effects with medications prescribed/recommended today, ER and return-to-clinic precautions discussed, patient verbalized understanding.      Jaynee Eagles, [place] 04/15/22 1446

## 2022-04-15 NOTE — ED Triage Notes (Signed)
Patient presents to UC for HA, sore throat, body aches, and congestion since yesterday. Not taking anything for symptom relief. States she had a negative covid test through health at work. Requesting flu and strep.

## 2022-04-18 LAB — CULTURE, GROUP A STREP (THRC)

## 2022-10-22 ENCOUNTER — Other Ambulatory Visit: Payer: Self-pay

## 2022-10-22 ENCOUNTER — Other Ambulatory Visit (HOSPITAL_BASED_OUTPATIENT_CLINIC_OR_DEPARTMENT_OTHER): Payer: Self-pay

## 2022-10-22 ENCOUNTER — Ambulatory Visit: Payer: Self-pay

## 2022-10-22 ENCOUNTER — Emergency Department (HOSPITAL_BASED_OUTPATIENT_CLINIC_OR_DEPARTMENT_OTHER)
Admission: EM | Admit: 2022-10-22 | Discharge: 2022-10-22 | Disposition: A | Discharge status: Home / Self Care | Payer: 59 | Attending: Emergency Medicine | Admitting: Emergency Medicine

## 2022-10-22 ENCOUNTER — Encounter (HOSPITAL_BASED_OUTPATIENT_CLINIC_OR_DEPARTMENT_OTHER): Payer: Self-pay

## 2022-10-22 DIAGNOSIS — T7840XA Allergy, unspecified, initial encounter: Secondary | ICD-10-CM | POA: Diagnosis not present

## 2022-10-22 MED ORDER — PREDNISONE 20 MG PO TABS: 40.0000 mg | ORAL_TABLET | Freq: Every day | ORAL | 0 refills | Status: AC

## 2022-10-22 MED ORDER — DEXAMETHASONE 4 MG PO TABS
10.0000 mg | ORAL_TABLET | Freq: Once | ORAL | Status: AC
  Administered 2022-10-22: 10 mg via ORAL
  Filled 2022-10-22: qty 3

## 2022-10-22 MED ORDER — LORATADINE 10 MG PO TABS
10.0000 mg | ORAL_TABLET | Freq: Every day | ORAL | Status: DC
  Administered 2022-10-22: 10 mg via ORAL
  Filled 2022-10-22: qty 1

## 2022-10-22 MED ORDER — FAMOTIDINE 20 MG PO TABS
20.0000 mg | ORAL_TABLET | Freq: Once | ORAL | Status: AC
  Administered 2022-10-22: 20 mg via ORAL
  Filled 2022-10-22: qty 1

## 2022-10-22 NOTE — ED Notes (Signed)
Reports taking Benadryl 50 mg PO last pm at 2000.

## 2022-10-22 NOTE — ED Provider Notes (Signed)
[facility] Provider Note   CSN: [id number] Arrival date & time: 10/22/22  1015     History  Chief Complaint  Patient presents with   Allergic Reaction    Kimberly Fuentes is a 27 y.o. female.   Allergic Reaction   27 year old female presents emergency department with complaints of allergic reaction.  Patient states that on Wednesday, went to have a "facial done" where new/unknown products were used on her face.  She states that since then, has had redness, itchy sensation of face.  She reports some sensation of swelling eyelids as well as rash on the left side of her face and neck of which seems to have improved this morning.  States that she took Benadryl last night and noted some improvement of symptoms upon awakening.  She denies shortness of breath, feelings of throat closing in on her but has had to clear her throat more frequently over the past few days but states this usually happens with seasonal allergies.  Denies fever, chills, chest pain, shortness of breath.  Past medical history significant for seasonal allergy, trichotillomania  Home Medications Prior to Admission medications   Medication Sig Start Date End Date Taking? Authorizing Provider  predniSONE (DELTASONE) 20 MG tablet Take 2 tablets (40 mg total) by mouth daily with breakfast for 5 days. 10/23/22 10/28/22 Yes Dion Saucier A, PA  benzonatate (TESSALON) 100 MG capsule Take 1-2 capsules (100-200 mg total) by mouth 3 (three) times daily as needed for cough. 04/15/22   Jaynee Eagles, PA-C  levocetirizine (XYZAL) 5 MG tablet Take 1 tablet (5 mg total) by mouth every evening. 04/15/22   Jaynee Eagles, PA-C  levonorgestrel (MIRENA) 20 MCG/24HR IUD 1 each by Intrauterine route once.    [provider]  meloxicam (MOBIC) 7.5 MG tablet Take 1 tablet (7.5 mg total) by mouth daily. 07/30/19   Trula Slade, DPM  pseudoephedrine (SUDAFED) 60 MG tablet Take 1 tablet (60 mg  total) by mouth every 8 (eight) hours as needed for congestion. 04/15/22   Jaynee Eagles, PA-C      Allergies    Cefazolin, Penicillins, and Hydrocodone    Review of Systems   Review of Systems  All other systems reviewed and are negative.   Physical Exam Updated Vital Signs BP 122/85 (BP Location: Right Arm)   Pulse 97   Temp 97.9 F (36.6 C) (Oral)   Resp 17   Ht 5\' 2"  (1.575 m)   Wt 70.3 kg   SpO2 100%   BMI 28.35 kg/m  Physical Exam Vitals and nursing note reviewed.  Constitutional:      General: She is not in acute distress.    Appearance: She is well-developed.  HENT:     Head: Normocephalic and atraumatic.     Comments: Patient without appreciable rash of face or neck.  No obvious swelling of eyelids or otherwise on face.  Patient complaining of pruritic type sensation diffusely across face.    Right Ear: Tympanic membrane normal.     Left Ear: Tympanic membrane normal.     Nose: Congestion present.     Mouth/Throat:     Mouth: Mucous membranes are moist.     Pharynx: Oropharynx is clear.  Eyes:     Conjunctiva/sclera: Conjunctivae normal.  Cardiovascular:     Rate and Rhythm: Normal rate and regular rhythm.     Heart sounds: No murmur heard. Pulmonary:     Effort: Pulmonary effort is normal.  No respiratory distress.     Breath sounds: Normal breath sounds.  Abdominal:     Palpations: Abdomen is soft.     Tenderness: There is no abdominal tenderness.  Musculoskeletal:        General: No swelling.     Cervical back: Neck supple.  Skin:    General: Skin is warm and dry.     Capillary Refill: Capillary refill takes less than 2 seconds.  Neurological:     Mental Status: She is alert.  Psychiatric:        Mood and Affect: Mood normal.     ED Results / Procedures / Treatments   Labs (all labs ordered are listed, but only abnormal results are displayed) Labs Reviewed - No data to display  EKG None  Radiology No results  found.  Procedures Procedures    Medications Ordered in ED Medications  loratadine (CLARITIN) tablet 10 mg (10 mg Oral Given 10/22/22 1043)  famotidine (PEPCID) tablet 20 mg (20 mg Oral Given 10/22/22 1043)  dexamethasone (DECADRON) tablet 10 mg (10 mg Oral Given 10/22/22 1042)    ED Course/ Medical Decision Making/ A&P                             Medical Decision Making Risk OTC drugs. Prescription drug management.   This patient presents to the ED for concern of allergic reaction, this involves an extensive number of treatment options, and is a complaint that carries with it a high risk of complications and morbidity.  The differential diagnosis includes angioedema, anaphylaxis, urticaria, allergic contact dermatitis, SJS/10   Co morbidities that complicate the patient evaluation  See HPI   Additional history obtained:  Additional history obtained from EMR External records from outside source obtained and reviewed including hospital records   Lab Tests:  N/a   Imaging Studies ordered:  N/a   Cardiac Monitoring: / EKG:  The patient was maintained on a cardiac monitor.  I personally viewed and interpreted the cardiac monitored which showed an underlying rhythm of: Sinus rhythm   Consultations Obtained:  N/a   Problem List / ED Course / Critical interventions / Medication management  Allergic reaction I ordered medication including Claritin, Pepcid, Decadron   Reevaluation of the patient after these medicines showed that the patient improved I have reviewed the patients home medicines and have made adjustments as needed   Social Determinants of Health:  Passive smoke exposure.  No illicit drug use.   Test / Admission - Considered:  Allergic reaction Vitals signs  within normal range and stable throughout visit. 27 year old female presents emergency department with complaints of allergic type reaction.  Patient with no observable rash, no  appreciable airway compromise.  Patient is with photo and hand that did show rash consistent with urticaria that was taken yesterday.  Symptoms seem to be isolated this morning to pruritus of face diffusely with no appreciable swelling.  Symptoms seem to respond well to Benadryl at home last night.  Patient noted positive response to medications administered while in the emergency department and continued medical therapy recommended at the same.  Recommend antihistamine therapy in the form of Zyrtec/Claritin/Allegra as well as abstinence from exacerbating facial products.  Given evidence of facial urticaria, will send in few days of corticosteroid therapy.  Recommend reevaluation by primary care for reassessment of symptoms.  Treatment plan discussed at length with patient she acknowledged understanding was agreeable to said plan. Worrisome  signs and symptoms were discussed with the patient, and the patient acknowledged understanding to return to the ED if noticed. Patient was stable upon discharge.          Final Clinical Impression(s) / ED Diagnoses Final diagnoses:  Allergic reaction, initial encounter    Rx / DC Orders ED Discharge Orders          Ordered    predniSONE (DELTASONE) 20 MG tablet  Daily with breakfast        10/22/22 [place], [place], [place] 10/22/22 1053    Dorie Rank, MD 10/25/22 [phone]

## 2022-10-22 NOTE — Discharge Instructions (Addendum)
Note the visit to the emergency department today was overall reassuring.  As discussed, recommend continued medical therapy at home with allergy medicine daily such as Zyrtec/Xyzal/Claritin/Allegra.  These are nonsedating antihistamines.  You may continue to use Benadryl at home at night if you find this beneficial.  I will send in prednisone to take for the next few days.  Recommend reevaluation by primary care for reassessment of your symptoms.  Please do not hesitate to return to emergency department for worrisome signs and symptoms we discussed become apparent.

## 2022-10-22 NOTE — ED Triage Notes (Signed)
In for eval of redness, itchy, and slight swelling to face sec to possible allergic reaction to facial on Wednesday at 3pm. Patient keep clearing her throat but also has reports having seasonal allergies.

## 2022-10-25 ENCOUNTER — Telehealth: Payer: Self-pay

## 2022-10-25 NOTE — Telephone Encounter (Signed)
LVM for pt to call office   [facility] Patient Name: Kimberly Fuentes Gender: Female DOB: Nov 22, 1995 Age: 27 Y 63 M 9 D Return Phone Number: [phone] (Primary) Address: City/ State/ Zip: [place]  [address] Client [facility] - Client Client Site [place] - Day Provider Raoul Pitch, [place] Type Call Who Is Calling Patient / Member / Family / Caregiver Call Type Triage / Clinical Relationship To Patient Self Return Phone Number [phone] (Primary) Chief Complaint Hives Reason for Call Symptomatic / Request for Health Information Initial Comment Pt needs to schedule an apt. Pt is having an allergic reaction and wants to get an e visit. She has hives on her face and neck. Translation No Nurse Assessment Nurse: Kimberly Goo, RN, Kimberly Fuentes Date/Time (Eastern Time): 10/22/2022 8:15:20 AM Confirm and document reason for call. If symptomatic, describe symptoms. ---Caller states she is having an allergic reaction, has splotchy redness and itching on face and neck. Started last night, neck has cleared up a bit. She got a facial Wed, face was fine after leaving. Used some facial products at home that night, woke up to swelling and redness to eyelids and cheekbone yesterday morning. She continued to use at home face products last night and continued to have redness, puffiness. Feels bumpy like there are blisters but she doesn't see them. Denies hives. Does the patient have any new or worsening symptoms? ---Yes Will a triage be completed? ---Yes Related visit to physician within the last 2 weeks? ---No Does the PT have any chronic conditions? (i.e. diabetes, asthma, this includes High risk factors for pregnancy, etc.) ---No Is the patient pregnant or possibly pregnant? (Ask all females between the ages of 80-55) ---No Is this a behavioral health or  substance abuse call? ---No Guidelines Guideline Title Affirmed Question Affirmed Notes Nurse Date/Time Kimberly Fuentes Time) Face Swelling [1] Swelling is red AND [2] very painful to touch Kimberly Goo, RN, Kimberly Fuentes 10/22/2022 8:20:30 AM PLEASE NOTE: All timestamps contained within this report are represented as [place] Standard Time. CONFIDENTIALTY NOTICE: This fax transmission is intended only for the addressee. It contains information that is legally privileged, confidential or otherwise protected from use or disclosure. If you are not the intended recipient, you are strictly prohibited from reviewing, disclosing, copying using or disseminating any of this information or taking any action in reliance on or regarding this information. If you have received this fax in error, please notify us immediately by telephone so that we can arrange for its return to [place]. Phone: [phone], Toll-Free: [phone], Fax: [phone] Page: 2 of 3 Call Id: [id number] Guidelines Guideline Title Affirmed Question Affirmed Notes Nurse Date/Time Kimberly Fuentes Time) Rash or Redness - Localized [1] Looks infected (spreading redness, pus) AND [2] large red area (> 2 in. or 5 cm) Quandt, RN, Kimberly Fuentes 10/22/2022 8:23:17 AM Disp. Time Kimberly Fuentes Time) Disposition Final User 10/22/2022 8:23:06 AM See HCP within 4 Hours (or PCP triage) Kimberly Goo, RN, Kimberly Fuentes 10/22/2022 8:25:15 AM See HCP within 4 Hours (or PCP triage) Yes Kimberly Goo, RN, Kimberly Fuentes Final Disposition 10/22/2022 8:25:15 AM See HCP within 4 Hours (or PCP triage) Yes Quandt, RN, Kimberly Fuentes Disagree/Comply Comply Caller Understands Yes PreDisposition Did not know what to do Care Advice Given Per Guideline SEE HCP (OR PCP TRIAGE) WITHIN 4 HOURS: * IF OFFICE WILL BE OPEN: You need to be seen within the next 3 or 4 hours. Call your doctor (or NP/PA)  now or as soon as the office opens. CALL BACK IF: * You become worse CARE ADVICE given per Face Swelling (Adult) guideline. SEE HCP  (OR PCP TRIAGE) WITHIN 4 HOURS: * IF OFFICE WILL BE OPEN: You need to be seen within the next 3 or 4 hours. Call your doctor (or NP/PA) now or as soon as the office opens. CALL BACK IF: * You become worse CARE ADVICE given per Rash - Localized and Cause Unknown (Adult) guideline. Comments User: Kimberly Crumble, RN Date/Time Kimberly Fuentes Time): 10/22/2022 8:20:28 AM took benadryl last night User: Kimberly Crumble, RN Date/Time Kimberly Fuentes Time): 10/22/2022 8:30:12 AM unable to reach office staff by backline, recommended UC Referrals [facility]

## 2022-11-10 DIAGNOSIS — N907 Vulvar cyst: Secondary | ICD-10-CM | POA: Diagnosis not present

## 2023-02-02 DIAGNOSIS — Z6828 Body mass index (BMI) 28.0-28.9, adult: Secondary | ICD-10-CM | POA: Diagnosis not present

## 2023-02-02 DIAGNOSIS — Z30432 Encounter for removal of intrauterine contraceptive device: Secondary | ICD-10-CM | POA: Diagnosis not present

## 2023-02-02 DIAGNOSIS — Z01419 Encounter for gynecological examination (general) (routine) without abnormal findings: Secondary | ICD-10-CM | POA: Diagnosis not present

## 2023-02-02 DIAGNOSIS — Z309 Encounter for contraceptive management, unspecified: Secondary | ICD-10-CM | POA: Diagnosis not present

## 2023-02-02 LAB — HM PAP SMEAR: HM Pap smear: NORMAL

## 2023-02-22 ENCOUNTER — Encounter: Payer: Self-pay | Admitting: Family Medicine

## 2023-02-22 ENCOUNTER — Other Ambulatory Visit: Payer: Self-pay

## 2023-02-22 NOTE — Progress Notes (Unsigned)
Patient ID: Kimberly Fuentes, female  DOB: Oct 30, 1995, 27 y.o.   MRN: [id number] Patient Care Team    Relationship Specialty Notifications Start End  Ranae Pila, MD Consulting Physician Obstetrics and Gynecology  09/10/16   Elinor Parkinson, [place] Consulting Physician Podiatry  02/22/23     No chief complaint on file.   Subjective:  Kimberly Fuentes is a 27 y.o.  Female  present for CPE /establishment. All past medical history, surgical history, allergies, family history, immunizations, medications and social history were updated in the electronic medical record today. All recent labs, ED visits and hospitalizations within the last year were reviewed.  Health maintenance:  Colonoscopy: No family history.  Routine screening at 45  Mammogram: No family history, routine screening at 51  Cervical cancer screening: last pap: 02/02/2023-Dr. Elon Spanner Immunizations: tdap UTD 2017, Influenza  (encouraged yearly) Infectious disease screening: HIV ***, Hep C *** DEXA: Routine screening Patient has a Dental home. Hospitalizations/ED visits: Reviewed       08/22/2017    1:24 PM 04/14/2016    1:09 PM  Depression screen PHQ 2/9  Decreased Interest 0 0  Down, Depressed, Hopeless 0 0  PHQ - 2 Score 0 0       No data to display                 Immunization History  Administered Date(s) Administered   HPV 9-valent 12/04/2019, 01/08/2020, 12/29/2020   Influenza,inj,Quad PF,6+ Mos 05/01/2015, 04/14/2016   PPD Test 11/10/2015, 04/14/2016, 04/21/2016   Tdap 02/23/2007, 04/14/2016   Unspecified SARS-COV-2 Vaccination 08/31/2019, 10/11/2019     Past Medical History:  Diagnosis Date   Chronic nonintractable headache 02/03/2018   Seasonal allergies    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   UTI (lower urinary tract infection)    Allergies  Allergen Reactions   Cefazolin Hives   Penicillins Hives   Hydrocodone Itching    Facial itching when taking after wisdom teeth  extraction   Past Surgical History:  Procedure Laterality Date   TONSILLECTOMY AND ADENOIDECTOMY  2000   WISDOM TOOTH EXTRACTION  2012   Family History  Problem Relation Age of Onset   Miscarriages / [place] Mother    Diabetes Father    Social History   Social History Narrative   Marital status/children/pets: single   Education/employment: employed   Field seismologist:      -Wears a bicycle helmet riding a bike: Yes     -smoke alarm in the home:Yes     - wears seatbelt: Yes     - Feels safe in their relationships: Yes                      Allergies as of 02/23/2023       Reactions   Cefazolin Hives   Penicillins Hives   Hydrocodone Itching   Facial itching when taking after wisdom teeth extraction        Medication List    as of February 22, 2023 12:51 PM   You have not been prescribed any medications.     All past medical history, surgical history, allergies, family history, immunizations andmedications were updated in the EMR today and reviewed under the history and medication portions of their EMR.     No results found for this or any previous visit (from the past 2160 hour(s)).  No results found.   ROS 14 pt review of systems performed and negative (unless mentioned in an HPI)  Objective:  There were no vitals taken for this visit.  Physical Exam Vitals and nursing note reviewed.  Constitutional:      General: She is not in acute distress.    Appearance: Normal appearance. She is not ill-appearing or toxic-appearing.  HENT:     Head: Normocephalic and atraumatic.     Right Ear: Tympanic membrane, ear canal and external ear normal. There is no impacted cerumen.     Left Ear: Tympanic membrane, ear canal and external ear normal. There is no impacted cerumen.     Nose: No congestion or rhinorrhea.     Mouth/Throat:     Mouth: Mucous membranes are moist.     Pharynx: Oropharynx is clear. No oropharyngeal exudate or posterior oropharyngeal erythema.   Eyes:     General: No scleral icterus.       Right eye: No discharge.        Left eye: No discharge.     Extraocular Movements: Extraocular movements intact.     Conjunctiva/sclera: Conjunctivae normal.     Pupils: Pupils are equal, round, and reactive to light.  Cardiovascular:     Rate and Rhythm: Normal rate and regular rhythm.     Pulses: Normal pulses.     Heart sounds: Normal heart sounds. No murmur heard.    No friction rub. No gallop.  Pulmonary:     Effort: Pulmonary effort is normal. No respiratory distress.     Breath sounds: Normal breath sounds. No stridor. No wheezing, rhonchi or rales.  Chest:     Chest wall: No tenderness.  Abdominal:     General: Abdomen is flat. Bowel sounds are normal. There is no distension.     Palpations: Abdomen is soft. There is no mass.     Tenderness: There is no abdominal tenderness. There is no right CVA tenderness, left CVA tenderness, guarding or rebound.     Hernia: No hernia is present.  Musculoskeletal:        General: No swelling, tenderness or deformity. Normal range of motion.     Cervical back: Normal range of motion and neck supple. No rigidity or tenderness.     Right lower leg: No edema.     Left lower leg: No edema.  Lymphadenopathy:     Cervical: No cervical adenopathy.  Skin:    General: Skin is warm and dry.     Coloration: Skin is not jaundiced or pale.     Findings: No bruising, erythema, lesion or rash.  Neurological:     General: No focal deficit present.     Mental Status: She is alert and oriented to person, place, and time. Mental status is at baseline.     Cranial Nerves: No cranial nerve deficit.     Sensory: No sensory deficit.     Motor: No weakness.     Coordination: Coordination normal.     Gait: Gait normal.     Deep Tendon Reflexes: Reflexes normal.  Psychiatric:        Mood and Affect: Mood normal.        Behavior: Behavior normal.        Thought Content: Thought content normal.         Judgment: Judgment normal.        No results found.  Assessment/plan: LAKICIA WIGAND is a 27 y.o. female present for CPE  ***   Patient was encouraged to exercise greater than 150 minutes a week. Patient was encouraged to choose a diet  filled with fresh fruits and vegetables, and lean meats. AVS provided to patient today for education/recommendation on gender specific health and safety maintenance. *** No follow-ups on file.  No orders of the defined types were placed in this encounter.   No orders of the defined types were placed in this encounter.  No orders of the defined types were placed in this encounter.  Referral Orders  No referral(s) requested today     Electronically signed by: Felix Pacini, DO [facility]

## 2023-02-22 NOTE — Patient Instructions (Signed)

## 2023-02-23 ENCOUNTER — Telehealth: Payer: Self-pay | Admitting: Family Medicine

## 2023-02-23 ENCOUNTER — Encounter: Payer: Self-pay | Admitting: Family Medicine

## 2023-02-23 ENCOUNTER — Ambulatory Visit: Payer: 59 | Admitting: Family Medicine

## 2023-02-23 VITALS — BP 117/78 | HR 64 | Temp 98.0°F | Ht 62.0 in | Wt 155.0 lb

## 2023-02-23 DIAGNOSIS — Z114 Encounter for screening for human immunodeficiency virus [HIV]: Secondary | ICD-10-CM | POA: Diagnosis not present

## 2023-02-23 DIAGNOSIS — Z1159 Encounter for screening for other viral diseases: Secondary | ICD-10-CM

## 2023-02-23 DIAGNOSIS — Z7689 Persons encountering health services in other specified circumstances: Secondary | ICD-10-CM

## 2023-02-23 DIAGNOSIS — Z13220 Encounter for screening for lipoid disorders: Secondary | ICD-10-CM | POA: Diagnosis not present

## 2023-02-23 DIAGNOSIS — Z0000 Encounter for general adult medical examination without abnormal findings: Secondary | ICD-10-CM

## 2023-02-23 DIAGNOSIS — R7989 Other specified abnormal findings of blood chemistry: Secondary | ICD-10-CM | POA: Insufficient documentation

## 2023-02-23 LAB — COMPREHENSIVE METABOLIC PANEL
ALT: 59 U/L — ABNORMAL HIGH (ref 0–35)
AST: 45 U/L — ABNORMAL HIGH (ref 0–37)
Albumin: 4.8 g/dL (ref 3.5–5.2)
Alkaline Phosphatase: 97 U/L (ref 39–117)
BUN: 14 mg/dL (ref 6–23)
CO2: 28 mEq/L (ref 19–32)
Calcium: 10 mg/dL (ref 8.4–10.5)
Chloride: 102 mEq/L (ref 96–112)
Creatinine, Ser: 0.95 mg/dL (ref 0.40–1.20)
GFR: 81.95 mL/min (ref >60.00)
Glucose, Bld: 83 mg/dL (ref 70–99)
Potassium: 4.3 mEq/L (ref 3.5–5.1)
Sodium: 139 mEq/L (ref 135–145)
Total Bilirubin: 0.6 mg/dL (ref 0.2–1.2)
Total Protein: 7.2 g/dL (ref 6.0–8.3)

## 2023-02-23 LAB — LIPID PANEL
Cholesterol: 168 mg/dL (ref 0–200)
HDL: 55.7 mg/dL (ref >39.00)
LDL Cholesterol: 87 mg/dL (ref 0–99)
NonHDL: 112.01
Total CHOL/HDL Ratio: 3
Triglycerides: 126 mg/dL (ref 0.0–149.0)
VLDL: 25.2 mg/dL (ref 0.0–40.0)

## 2023-02-23 LAB — CBC
HCT: 44.8 % (ref 36.0–46.0)
Hemoglobin: 14.9 g/dL (ref 12.0–15.0)
MCHC: 33.2 g/dL (ref 30.0–36.0)
MCV: 86.9 fl (ref 78.0–100.0)
Platelets: 229 10*3/uL (ref 150.0–400.0)
RBC: 5.15 Mil/uL — ABNORMAL HIGH (ref 3.87–5.11)
RDW: 13.1 % (ref 11.5–15.5)
WBC: 9.6 10*3/uL (ref 4.0–10.5)

## 2023-02-23 LAB — TSH: TSH: 1.89 u[IU]/mL (ref 0.35–5.50)

## 2023-02-23 NOTE — Telephone Encounter (Signed)
Please inform patient Blood cell counts are normal. Cholesterol panel is at goal. Kidney function is normal. Her liver function tests are mildly elevated.  I recommend we repeat these in 4 weeks by lab appointment only to ensure they are returning to normal.  If they are still elevated at that recheck, then we would need to see her back for further evaluation in person.

## 2023-02-24 NOTE — Telephone Encounter (Signed)
LM for pt to return call to discuss.  

## 2023-02-24 NOTE — Telephone Encounter (Signed)
Pt advised of results. Pt wanted to know if there was a reason for the elevated lab and if CBC could be an early sign of pregnancy. States she is trying and was curious.Marland Kitchen

## 2023-02-25 NOTE — Telephone Encounter (Addendum)
Patient CBC was normal it was not elevated. Her liver enzymes are mildly elevated.  This can be caused by many causes including symptoms she recently ate or drank in, down to viral illnesses and autoimmune disorders.  She has not had lab work in our system since 2019 and they were normal then, that is all we have to compare to.  Do not suspect this to be anything concerning at this time, however we need to ensure they return to normal and do not increase.  In pregnancy towards later trimesters alkaline phosphatase can be elevated, but not necessarily liver enzymes.

## 2023-03-18 DIAGNOSIS — N912 Amenorrhea, unspecified: Secondary | ICD-10-CM | POA: Diagnosis not present

## 2023-03-21 DIAGNOSIS — N912 Amenorrhea, unspecified: Secondary | ICD-10-CM | POA: Diagnosis not present

## 2023-03-24 ENCOUNTER — Other Ambulatory Visit: Payer: Self-pay | Admitting: Family Medicine

## 2023-03-24 ENCOUNTER — Other Ambulatory Visit: Payer: 59

## 2023-03-24 DIAGNOSIS — R7983 Abnormal findings of blood amino-acid level: Secondary | ICD-10-CM | POA: Diagnosis not present

## 2023-03-26 LAB — HEPATIC FUNCTION PANEL
ALT: 19 IU/L
AST: 18 IU/L (ref 0–40)
Albumin: 4.2 g/dL
Alkaline Phosphatase: 99 IU/L (ref 44–121)
Bilirubin Total: 0.3 mg/dL (ref 0.0–1.2)
Bilirubin, Direct: 0.11 mg/dL (ref 0.00–0.40)
Total Protein: 6.1 g/dL (ref 6.0–8.5)

## 2023-03-29 DIAGNOSIS — N911 Secondary amenorrhea: Secondary | ICD-10-CM | POA: Diagnosis not present

## 2023-04-19 DIAGNOSIS — Z3400 Encounter for supervision of normal first pregnancy, unspecified trimester: Secondary | ICD-10-CM | POA: Diagnosis not present

## 2023-04-19 DIAGNOSIS — Z3481 Encounter for supervision of other normal pregnancy, first trimester: Secondary | ICD-10-CM | POA: Diagnosis not present

## 2023-04-19 DIAGNOSIS — Z3685 Encounter for antenatal screening for Streptococcus B: Secondary | ICD-10-CM | POA: Diagnosis not present

## 2023-04-19 DIAGNOSIS — Z3401 Encounter for supervision of normal first pregnancy, first trimester: Secondary | ICD-10-CM | POA: Diagnosis not present

## 2023-04-19 DIAGNOSIS — Z3A09 9 weeks gestation of pregnancy: Secondary | ICD-10-CM | POA: Diagnosis not present

## 2023-04-19 DIAGNOSIS — Z31430 Encounter of female for testing for genetic disease carrier status for procreative management: Secondary | ICD-10-CM | POA: Diagnosis not present

## 2023-04-19 LAB — HEPATITIS C ANTIBODY: HCV Ab: NEGATIVE

## 2023-04-19 LAB — OB RESULTS CONSOLE RPR: RPR: NONREACTIVE

## 2023-04-19 LAB — OB RESULTS CONSOLE HEPATITIS B SURFACE ANTIGEN: Hepatitis B Surface Ag: NEGATIVE

## 2023-04-19 LAB — OB RESULTS CONSOLE GC/CHLAMYDIA
Chlamydia: NEGATIVE
Neisseria Gonorrhea: NEGATIVE

## 2023-04-19 LAB — OB RESULTS CONSOLE ANTIBODY SCREEN: Antibody Screen: NEGATIVE

## 2023-04-19 LAB — OB RESULTS CONSOLE ABO/RH: RH Type: NEGATIVE

## 2023-04-19 LAB — OB RESULTS CONSOLE HIV ANTIBODY (ROUTINE TESTING): HIV: NONREACTIVE

## 2023-04-19 LAB — OB RESULTS CONSOLE RUBELLA ANTIBODY, IGM: Rubella: IMMUNE

## 2023-05-02 ENCOUNTER — Other Ambulatory Visit: Payer: Self-pay | Admitting: Obstetrics and Gynecology

## 2023-05-02 DIAGNOSIS — Z3400 Encounter for supervision of normal first pregnancy, unspecified trimester: Secondary | ICD-10-CM | POA: Diagnosis not present

## 2023-05-02 DIAGNOSIS — Z331 Pregnant state, incidental: Secondary | ICD-10-CM | POA: Diagnosis not present

## 2023-05-02 DIAGNOSIS — Z3A11 11 weeks gestation of pregnancy: Secondary | ICD-10-CM | POA: Diagnosis not present

## 2023-05-02 DIAGNOSIS — Z363 Encounter for antenatal screening for malformations: Secondary | ICD-10-CM

## 2023-05-02 DIAGNOSIS — Q992 Fragile X chromosome: Secondary | ICD-10-CM

## 2023-05-02 DIAGNOSIS — Z113 Encounter for screening for infections with a predominantly sexual mode of transmission: Secondary | ICD-10-CM | POA: Diagnosis not present

## 2023-05-02 LAB — OB RESULTS CONSOLE GC/CHLAMYDIA
Chlamydia: NEGATIVE
Neisseria Gonorrhea: NEGATIVE

## 2023-05-17 ENCOUNTER — Encounter: Payer: Self-pay | Admitting: Family Medicine

## 2023-05-17 ENCOUNTER — Ambulatory Visit: Payer: 59 | Admitting: Family Medicine

## 2023-05-17 VITALS — BP 108/72 | HR 82 | Temp 97.6°F | Wt 158.8 lb

## 2023-05-17 DIAGNOSIS — R509 Fever, unspecified: Secondary | ICD-10-CM | POA: Diagnosis not present

## 2023-05-17 LAB — POC INFLUENZA A&B (BINAX/QUICKVUE)
Influenza A, POC: NEGATIVE
Influenza B, POC: NEGATIVE

## 2023-05-17 LAB — POCT RAPID STREP A (OFFICE): Rapid Strep A Screen: NEGATIVE

## 2023-05-17 LAB — POC COVID19 BINAXNOW: SARS Coronavirus 2 Ag: NEGATIVE

## 2023-05-17 MED ORDER — ONDANSETRON 4 MG PO TBDP: 4.0000 mg | ORAL_TABLET | Freq: Three times a day (TID) | ORAL | 0 refills | Status: DC | PRN

## 2023-05-17 NOTE — Progress Notes (Signed)
Kimberly Fuentes , 07-16-1996, 27 y.o., female MRN: [id number] Patient Care Team    Relationship Specialty Notifications Start End  Natalia Leatherwood, DO PCP - General Family Medicine  02/23/23   Ranae Pila, MD Consulting Physician Obstetrics and Gynecology  09/10/16   Elinor Parkinson, [place] Consulting Physician Podiatry  02/22/23     Chief Complaint  Patient presents with   Fever    Sunday morning onset; exposed to strep     Subjective: Kimberly Fuentes is a 27 y.o. Pt presents for an OV with complaints of vomit, nausea, fever, weak and myalgia of 3 days duration.  She reports she was exposed to strep. Last fever Sunday evening.  Pt has tried nothing to ease their symptoms.  She was able to eat breakfast today.      02/23/2023    1:16 PM 08/22/2017    1:24 PM 04/14/2016    1:09 PM  Depression screen PHQ 2/9  Decreased Interest 0 0 0  Down, Depressed, Hopeless 0 0 0  PHQ - 2 Score 0 0 0    Allergies  Allergen Reactions   Cefazolin Hives   Penicillins Hives   Hydrocodone Itching    Facial itching when taking after wisdom teeth extraction   Wound Dressing Adhesive Itching and Rash   Social History   Social History Narrative   Marital status/children/pets: single   Education/employment: employed   Field seismologist:      -Wears a bicycle helmet riding a bike: Yes     -smoke alarm in the home:Yes     - wears seatbelt: Yes     - Feels safe in their relationships: Yes                     Past Medical History:  Diagnosis Date   Chronic nonintractable headache 02/03/2018   Seasonal allergies    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 If you have any questions, ask your nurse or doctor.          ondansetron 4 MG disintegrating tablet Commonly known as: ZOFRAN-ODT Take 1 tablet (4 mg total) by mouth every 8 (eight) hours as needed for nausea or vomiting. Started by: Felix Pacini   prenatal multivitamin Tabs tablet Take 1 tablet by mouth daily at 12 noon.        All past medical history, surgical history, allergies, family history, immunizations andmedications were updated in the EMR today and reviewed under the history and medication portions of their EMR.     ROS Negative, with the exception of above mentioned in HPI   Objective:  BP 108/72   Pulse 82   Temp 97.6 F (36.4 C) (Temporal)   Wt 158 lb 12.8 oz (72 kg)   LMP 02/04/2023  SpO2 99%   BMI 29.04 kg/m  Body mass index is 29.04 kg/m. Physical Exam Vitals and nursing note reviewed.  Constitutional:      General: She is not in acute distress.    Appearance: Normal appearance. She is normal weight. She is not ill-appearing or toxic-appearing.  HENT:     Head: Normocephalic and atraumatic.  Eyes:     General: No scleral icterus.       Right eye: No discharge.        Left eye: No discharge.     Extraocular Movements: Extraocular movements intact.     Conjunctiva/sclera: Conjunctivae normal.     Pupils: Pupils are equal, round, and reactive to light.  Cardiovascular:     Rate and Rhythm: Normal rate and regular rhythm.  Pulmonary:     Effort: Pulmonary effort is normal. No respiratory distress.     Breath sounds: No wheezing, rhonchi or rales.  Musculoskeletal:     Cervical back: Neck supple.  Lymphadenopathy:     Cervical: No cervical adenopathy.  Skin:    Findings: No rash.  Neurological:     Mental Status: She  is alert and oriented to person, place, and time. Mental status is at baseline.     Motor: No weakness.     Coordination: Coordination normal.     Gait: Gait normal.  Psychiatric:        Mood and Affect: Mood normal.        Behavior: Behavior normal.        Thought Content: Thought content normal.        Judgment: Judgment normal.     No results found. No results found. Results for orders placed or performed in visit on 05/17/23 (from the past 24 hour(s))  POC Influenza A&B (Binax test)     Status: None   Collection Time: 05/17/23 11:40 AM  Result Value Ref Range   Influenza A, POC Negative Negative   Influenza B, POC Negative Negative  POCT rapid strep A     Status: None   Collection Time: 05/17/23 11:40 AM  Result Value Ref Range   Rapid Strep A Screen Negative Negative  POC COVID-19 BinaxNow     Status: None   Collection Time: 05/17/23 11:41 AM  Result Value Ref Range   SARS Coronavirus 2 Ag Negative Negative    Assessment/Plan: Kimberly Fuentes is a 27 y.o. female present for OV for  Fever, unspecified fever cause Rest, hydrate.  mucinex (DM if cough), nettie pot or nasal saline.  zofran prescribed prn F/U 2 weeks of not improved.   Reviewed expectations re: course of current medical issues. Discussed self-management of symptoms. Outlined signs and symptoms indicating need for more acute intervention. Patient verbalized understanding and all questions were answered. Patient received an After-Visit Summary.    Orders Placed This Encounter  Procedures   POC Influenza A&B (Binax test)   POC COVID-19 BinaxNow   POCT rapid strep A   Meds ordered this encounter  Medications   ondansetron (ZOFRAN-ODT) 4 MG disintegrating tablet    Sig: Take 1 tablet (4 mg total) by mouth every 8 (eight) hours as needed for nausea or vomiting.    Dispense:  20 tablet    Refill:  0   Referral Orders  No referral(s) requested today     Note is dictated utilizing voice  recognition software. Although note has been proof read prior to signing, occasional typographical errors still can be missed. If any  questions arise, please do not hesitate to call for verification.   electronically signed by:  Felix Pacini, DO  [facility] [place]

## 2023-05-17 NOTE — Patient Instructions (Addendum)
Return in about 2 weeks (around 05/31/2023), or if symptoms worsen or fail to improve.        Great to see you today.  I have refilled the medication(s) we provide.   If labs were collected or images ordered, we will inform you of  results once we have received them and reviewed. We will contact you either by echart message, or telephone call.  Please give ample time to the testing facility, and our office to run,  receive and review results. Please do not call inquiring of results, even if you can see them in your chart. We will contact you as soon as we are able. If it has been over 1 week since the test was completed, and you have not yet heard from [place], then please call us.    - echart message- for normal results that have been seen by the patient already.   - telephone call: abnormal results or if patient has not viewed results in their echart.  If a referral to a specialist was entered for you, please call us in 2 weeks if you have not heard from the specialist office to schedule.

## 2023-05-23 ENCOUNTER — Other Ambulatory Visit: Payer: Self-pay

## 2023-05-30 DIAGNOSIS — N760 Acute vaginitis: Secondary | ICD-10-CM | POA: Diagnosis not present

## 2023-05-30 DIAGNOSIS — Z361 Encounter for antenatal screening for raised alphafetoprotein level: Secondary | ICD-10-CM | POA: Diagnosis not present

## 2023-05-30 DIAGNOSIS — Z3400 Encounter for supervision of normal first pregnancy, unspecified trimester: Secondary | ICD-10-CM | POA: Diagnosis not present

## 2023-05-30 DIAGNOSIS — Z3A15 15 weeks gestation of pregnancy: Secondary | ICD-10-CM | POA: Diagnosis not present

## 2023-06-08 ENCOUNTER — Encounter: Payer: Self-pay | Admitting: *Deleted

## 2023-06-08 DIAGNOSIS — Z148 Genetic carrier of other disease: Secondary | ICD-10-CM | POA: Insufficient documentation

## 2023-06-20 ENCOUNTER — Ambulatory Visit: Payer: 59

## 2023-06-20 ENCOUNTER — Ambulatory Visit: Payer: 59 | Attending: Obstetrics and Gynecology

## 2023-06-20 ENCOUNTER — Ambulatory Visit: Payer: 59 | Admitting: *Deleted

## 2023-06-20 ENCOUNTER — Encounter: Payer: Self-pay | Admitting: *Deleted

## 2023-06-20 ENCOUNTER — Other Ambulatory Visit: Payer: Self-pay

## 2023-06-20 VITALS — BP 119/71 | HR 88

## 2023-06-20 DIAGNOSIS — Q992 Fragile X chromosome: Secondary | ICD-10-CM | POA: Insufficient documentation

## 2023-06-20 DIAGNOSIS — Z148 Genetic carrier of other disease: Secondary | ICD-10-CM

## 2023-06-20 DIAGNOSIS — Z363 Encounter for antenatal screening for malformations: Secondary | ICD-10-CM | POA: Diagnosis not present

## 2023-06-20 DIAGNOSIS — O358XX0 Maternal care for other (suspected) fetal abnormality and damage, not applicable or unspecified: Secondary | ICD-10-CM | POA: Diagnosis not present

## 2023-06-21 NOTE — Progress Notes (Signed)
[facility] at [facility] [address] Phone:  [phone]   Fax:  [phone]      In-Person Genetic Counseling Clinic Note:   I spoke with 27 y.o. 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 She was referred by Ranae Pila, MD. She was accompanied by her mother.   Pregnancy History:    G1P0. EGA: [redacted]w[redacted]d by [place]. EDD: 11/20/2023. This is Kimberly Fuentes's first pregnancy.  Denies personal health concerns. Denies bleeding, infections, and fevers in this pregnancy. Denies using tobacco, alcohol, or street drugs in this pregnancy.   Family History:    A three-generation pedigree was created and scanned into Epic under the Media tab.  Kimberly reports that her mother has an 72 yo paternal half-brother with autism who is nonverbal. It is unknown whether genetic testing was performed. We discussed that autism is typically multifactorial, while ~20% of cases of autism are due to a genetic cause. We reviewed that fragile X syndrome is the most common genetic cause of autism in males. A detailed discussion regarding fragile X syndrome is included below. If Kimberly learns more about her family member's diagnosis, we are happy to revisit and reassess risk to the pregnancy  Kimberly reports that her mother's maternal first cousin has Friedreich ataxia. Because this is an autosomal recessive condition, we assume that Kimberly has a 1/8 (12.5%) chance of being a carrier. Carrier screening for Friedreich ataxia was offered to Kimberly. She declined at this time.  Maternal ethnicity reported as White and paternal ethnicity reported as White. Denies Ashkenazi Jewish ancestry.  Family history not remarkable for consanguinity, individuals with birth defects, intellectual disability, autism spectrum disorder, multiple spontaneous abortions, still births, or unexplained  neonatal death.   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 She carries the premutation allele with 55 CGG repeats and a normal allele with 32 CGG repeats. Within the 55 CGG allele, she carries 1 AGG interruption. Please see report for details.  Fragile X syndrome is an X-linked condition caused by a mutation in the FMR1 gene known due to increased CGG repeats. Humans typically have between 5 and 44 CGG repeats in the FMR1 gene. If a person has between 45 and 54 CGG repeats, they are an intermediate carrier. If a person has between 55 and 200 repeats, they are a premutation carrier. Individuals with >200 CGG repeats (full mutation) are considered to be affected with fragile X syndrome. Alleles with the intermediate repeat size or greater are at risk of expansion to larger mutations when passed down to offspring. It was discussed with Kimberly that there is a 50% risk she will pass on her X chromosome with the 55 CGG repeat length in each pregnancy, and if passed down, there is a <1% chance that this will expand to a full mutation. More than likely, this allele will stay within the premutation range if passed down to offspring.  Recent literature suggests that the risk of expansion to a full mutation is influenced by another set of trinucleotides called AGG. AGG repeats within the CGG repeat region in the FMR1 gene influence the risk of expansion to a full mutation from parent to child. CGG repeat regions with no AGG interruptions have the greatest risk for full mutation expansion. Thus, AGG repeat testing is critical to accurately assess  an individual's chance of having a child with >200 CGG repeats. Kimberly was identified to carry 1 AGG interruption in her premutation allele. Given this result, the laboratory estimates a less than 1% chance that the premutation will expand into a full mutation if it is passed on to her  children.  Most individuals with >200 CGG repeats have abnormal gene methylation that silences the FMR1 gene, preventing production of the FMRP protein. This protein has a role in the development of synapses that are critical for relaying nerve impulses. Loss or deficiency of the FMRP protein leads to the symptoms associated with fragile X syndrome, including mild to moderate intellectual disability, developmental delays, autism, behavioral abnormalities, and characteristic physical features. We discussed that males with >200 CGG repeats are more severely affected than females since males have one X chromosome and females have two X chromosomes. Given the risk to the current female pregnancy, Kimberly was offered amniocentesis for prenatal diagnosis. We discussed the technical aspects, benefits, risks, and limitations of amniocentesis including the 1 in 500 risk for miscarriage. After careful consideration, Kimberly declined amniocentesis for prenatal diagnosis. Kimberly stated that she would like for her baby to be referred to pediatric genetics after birth for a consultation and discussion of genetic testing.   Given Aiyonna's carrier screening results, we do not consider Kimberly to be affected with fragile X syndrome; however, premutation carriers may sometimes have behavior problems, social anxiety, and/or difficulty with social skills. We discussed with Kimberly that about 15-27% of females with a premutation have a condition called Fragile X-Associated Primary Ovarian Insufficiency (FXPOI). These females may have irregular menstrual cycles, early-onset elevated levels of follicle stimulating hormone [facility]), early menopause, and may have infertility or a more difficult time having children. Females who are premutation carriers also have an 8-17% chance of developing a disorder known as Fragile X-Associated Tremor/Ataxia Syndrome (FXTAS). Females with FXTAS have problems starting after age 22 that include  problems with movement (ataxia), tremor, memory loss, loss of sensation in the lower extremities, and mental and behavioral changes. Males who are premutation carriers have a 40-45% chance of developing FXTAS after the age of 51.   Newborn Screening. The [place] Newborn Screening (NBS) program will screen all newborn babies for cystic fibrosis, spinal muscular atrophy, hemoglobinopathies, and numerous other conditions.  Previous Testing Completed:  Low risk NIPS: Kimberly previously completed Panorama noninvasive prenatal screening (NIPS) in this pregnancy. The result is low risk, consistent with a female fetus. This screening significantly reduces but does not eliminate the chance that the current pregnancy has Down syndrome (trisomy 83), trisomy 55, trisomy 30, common sex chromosome conditions, and 22q11.2 microdeletion syndrome. Please see report for details. There are many genetic conditions that cannot be detected by NIPS.    Plan of Care:   Declined amniocentesis for fragile X syndrome. Declined carrier screening for Friedreich ataxia. Kimberly Fuentes is interested in postnatal referral to pediatric genetics regarding fragile X syndrome.   Informed consent was obtained. All questions were answered.   I spent 60 minutes in the care of the patient today, including face-to-face time reviewing and  discussing the genetic test results and available next steps.    Thank you for sharing in the care of Kimberly with [place].  Please do not hesitate to contact us at [phone] if you have any questions.   Sheppard Plumber, MS Genetic Counselor   Genetic counseling student involved in appointment: Yes Kimberly Fuentes).

## 2023-06-28 ENCOUNTER — Ambulatory Visit: Payer: Self-pay

## 2023-09-02 DIAGNOSIS — O368930 Maternal care for other specified fetal problems, third trimester, not applicable or unspecified: Secondary | ICD-10-CM | POA: Diagnosis not present

## 2023-09-02 DIAGNOSIS — Z3480 Encounter for supervision of other normal pregnancy, unspecified trimester: Secondary | ICD-10-CM | POA: Diagnosis not present

## 2023-09-02 DIAGNOSIS — Z3A28 28 weeks gestation of pregnancy: Secondary | ICD-10-CM | POA: Diagnosis not present

## 2023-09-02 DIAGNOSIS — Z3400 Encounter for supervision of normal first pregnancy, unspecified trimester: Secondary | ICD-10-CM | POA: Diagnosis not present

## 2023-10-02 DIAGNOSIS — Z3483 Encounter for supervision of other normal pregnancy, third trimester: Secondary | ICD-10-CM | POA: Diagnosis not present

## 2023-10-02 DIAGNOSIS — Z3482 Encounter for supervision of other normal pregnancy, second trimester: Secondary | ICD-10-CM | POA: Diagnosis not present

## 2023-10-12 DIAGNOSIS — Z3403 Encounter for supervision of normal first pregnancy, third trimester: Secondary | ICD-10-CM | POA: Diagnosis not present

## 2023-10-12 DIAGNOSIS — N760 Acute vaginitis: Secondary | ICD-10-CM | POA: Diagnosis not present

## 2023-10-12 DIAGNOSIS — N771 Vaginitis, vulvitis and vulvovaginitis in diseases classified elsewhere: Secondary | ICD-10-CM | POA: Diagnosis not present

## 2023-10-12 DIAGNOSIS — M545 Low back pain, unspecified: Secondary | ICD-10-CM | POA: Diagnosis not present

## 2023-10-12 DIAGNOSIS — O26899 Other specified pregnancy related conditions, unspecified trimester: Secondary | ICD-10-CM | POA: Diagnosis not present

## 2023-10-12 DIAGNOSIS — Z3A34 34 weeks gestation of pregnancy: Secondary | ICD-10-CM | POA: Diagnosis not present

## 2023-10-26 DIAGNOSIS — Z369 Encounter for antenatal screening, unspecified: Secondary | ICD-10-CM | POA: Diagnosis not present

## 2023-10-26 LAB — OB RESULTS CONSOLE GBS: GBS: NEGATIVE

## 2023-11-07 ENCOUNTER — Encounter (HOSPITAL_COMMUNITY): Payer: Self-pay | Admitting: Obstetrics and Gynecology

## 2023-11-07 ENCOUNTER — Inpatient Hospital Stay (HOSPITAL_COMMUNITY)
Admission: AD | Admit: 2023-11-07 | Discharge: 2023-11-07 | Disposition: A | Discharge status: Home / Self Care | Attending: Obstetrics and Gynecology | Admitting: Obstetrics and Gynecology

## 2023-11-07 DIAGNOSIS — O471 False labor at or after 37 completed weeks of gestation: Secondary | ICD-10-CM | POA: Diagnosis not present

## 2023-11-07 DIAGNOSIS — O479 False labor, unspecified: Secondary | ICD-10-CM | POA: Diagnosis not present

## 2023-11-07 DIAGNOSIS — Z3A38 38 weeks gestation of pregnancy: Secondary | ICD-10-CM | POA: Insufficient documentation

## 2023-11-07 NOTE — MAU Provider Note (Signed)
 S: Ms. Kimberly Fuentes is a 28 y.o. G1P0000 at [redacted]w[redacted]d  who presents to MAU today complaining contractions q 5-7 minutes since earlier today. She denies vaginal bleeding. She denies LOF. She reports normal fetal movement.    O: BP 121/82   Pulse (!) 104   Temp 98.3 F (36.8 C)   Resp 18   Ht 5' 2.75" (1.594 m)   Wt 88.9 kg   LMP 02/03/2023   SpO2 98%   BMI 35.00 kg/m  RN labor eval  Cervical exam:  Dilation: Closed Exam by:: Alondra Twitty RN   Fetal Monitoring: Baseline: 135 Variability: mod Accelerations: present Decelerations: absent Contractions: every 4-5 min   A/P: [facility] at [redacted]w[redacted]d here w contractions at term Cervix closed Discussed findings w pt, will d/c at this time [facility] and labor precautions given   Kimberly Vantine, MD 11/07/2023 7:44 PM

## 2023-11-07 NOTE — MAU Note (Signed)
.  Kimberly Fuentes is a 28 y.o. at [redacted]w[redacted]d here in MAU reporting: ctx started earlier today about 5-7 min apart. Denies any vag bleeding or leaking . Good fetal movement  LMP:  Onset of complaint: this morning.  Pain score: 4-5 Vitals:   11/07/23 1314  BP: 124/82  Pulse: (!) 104  Resp: 18  Temp: 98.3 F (36.8 C)     FHT: 154  Lab orders placed from triage: labor eval

## 2023-11-10 ENCOUNTER — Other Ambulatory Visit: Payer: Self-pay

## 2023-11-10 ENCOUNTER — Inpatient Hospital Stay (HOSPITAL_COMMUNITY)
Admission: AD | Admit: 2023-11-10 | Discharge: 2023-11-10 | Disposition: A | Discharge status: Home / Self Care | Payer: Commercial Managed Care - PPO | Attending: Obstetrics and Gynecology | Admitting: Obstetrics and Gynecology

## 2023-11-10 ENCOUNTER — Encounter (HOSPITAL_COMMUNITY): Payer: Self-pay | Admitting: Obstetrics and Gynecology

## 2023-11-10 DIAGNOSIS — R030 Elevated blood-pressure reading, without diagnosis of hypertension: Secondary | ICD-10-CM | POA: Insufficient documentation

## 2023-11-10 DIAGNOSIS — Z3A38 38 weeks gestation of pregnancy: Secondary | ICD-10-CM | POA: Diagnosis not present

## 2023-11-10 DIAGNOSIS — O26893 Other specified pregnancy related conditions, third trimester: Secondary | ICD-10-CM | POA: Insufficient documentation

## 2023-11-10 DIAGNOSIS — O163 Unspecified maternal hypertension, third trimester: Secondary | ICD-10-CM

## 2023-11-10 LAB — URINALYSIS, ROUTINE W REFLEX MICROSCOPIC
Bilirubin Urine: NEGATIVE
Glucose, UA: NEGATIVE mg/dL
Hgb urine dipstick: NEGATIVE
Ketones, ur: NEGATIVE mg/dL
Nitrite: NEGATIVE
Protein, ur: NEGATIVE mg/dL
Specific Gravity, Urine: 1.008 (ref 1.005–1.030)
pH: 7 (ref 5.0–8.0)

## 2023-11-10 LAB — PROTEIN / CREATININE RATIO, URINE
Creatinine, Urine: 48 mg/dL
Protein Creatinine Ratio: 0.21 mg/mg{creat} — ABNORMAL HIGH (ref 0.00–0.15)
Total Protein, Urine: 10 mg/dL

## 2023-11-10 LAB — COMPREHENSIVE METABOLIC PANEL WITH GFR
ALT: 20 U/L (ref 0–44)
AST: 24 U/L (ref 15–41)
Albumin: 3 g/dL — ABNORMAL LOW (ref 3.5–5.0)
Alkaline Phosphatase: 175 U/L — ABNORMAL HIGH (ref 38–126)
Anion gap: 10 (ref 5–15)
BUN: 7 mg/dL (ref 6–20)
CO2: 24 mmol/L (ref 22–32)
Calcium: 9.3 mg/dL (ref 8.9–10.3)
Chloride: 104 mmol/L (ref 98–111)
Creatinine, Ser: 0.85 mg/dL (ref 0.44–1.00)
GFR, Estimated: >60 mL/min (ref >60)
Glucose, Bld: 82 mg/dL (ref 70–99)
Potassium: 4.1 mmol/L (ref 3.5–5.1)
Sodium: 138 mmol/L (ref 135–145)
Total Bilirubin: 0.5 mg/dL (ref 0.0–1.2)
Total Protein: 6.3 g/dL — ABNORMAL LOW (ref 6.5–8.1)

## 2023-11-10 LAB — CBC
HCT: 35.4 % — ABNORMAL LOW (ref 36.0–46.0)
Hemoglobin: 11.3 g/dL — ABNORMAL LOW (ref 12.0–15.0)
MCH: 25.9 pg — ABNORMAL LOW (ref 26.0–34.0)
MCHC: 31.9 g/dL (ref 30.0–36.0)
MCV: 81 fL (ref 80.0–100.0)
Platelets: 241 10*3/uL (ref 150–400)
RBC: 4.37 MIL/uL (ref 3.87–5.11)
RDW: 16 % — ABNORMAL HIGH (ref 11.5–15.5)
WBC: 14.1 10*3/uL — ABNORMAL HIGH (ref 4.0–10.5)
nRBC: 0 % (ref 0.0–0.2)

## 2023-11-10 NOTE — MAU Note (Addendum)
 Kimberly Fuentes is a 28 y.o. at [redacted]w[redacted]d here in MAU reporting: she was sent from the office for elevated BP. She reports she's noticed floaters a couple of times this week, but denies visual disturbances currently. Denies epigastric pain. Endorses +FM, denies VB and LOF.    Pain score: denies   Vitals:   11/10/23 1518  BP: 138/83  Pulse: (!) 106  Resp: 17  Temp: 98.2 F (36.8 C)  SpO2: 100%     FHT: 150  Lab orders placed from triage: urine collected

## 2023-11-10 NOTE — Discharge Instructions (Signed)
 [employer] This circuit takes at least 90 minutes to complete so clear your schedule and make mental preparations so you can relax in your environment.  The second step requires a lot of pillows so gather them up before beginning.  Before starting, you should empty your bladder! Have a nice drink nearby, and make sure it has a straw! If you are having contractions, this circuit should be done through contractions, try not to change positions between steps.  Step One: Open-kneeChest Stay in this position for 30 minutes, start in cat/cow, then drop your chest as low as you can to the bed or the floor and your bottom as high as you can. Knees should be fairly wide apart, and the angle between the torso/thighs should be wider than 90 degrees. Wiggle around, prop with lots of pillows and use this time to get totally relaxed. This position allows the baby to scoot out of the pelvis a bit and gives them room to rotate, shift their head position, etc. If the pregnant person finds it helpful, careful positioning with a rebozo under the belly, with gentle tension from a support person behind can help maintain this position for the full 30 minutes.  Step BJY:[id number] SideLying Roll to your left side, bringing your top leg as high as possible and keeping your bottom leg straight. Roll forward as much as possible, again using a lot of pillows. Sink into the bed and relax some more. If you fall asleep, that's totally okay and you can stay there! If not, stay here for at least another half an hour. Try and get your top right leg up towards your head and get as rolled over onto your belly as much as possible. If you repeat the circuit during labor, try alternating left and right sides. We know the photo the left is actually right side... just flip the image in your head.  Step Three: Moving and Lunges Lunge, walk stairs facing sideways, 2 at a time, (have a spotter  downstairs of you!), take a walk outside with one foot on the curb and the other on the street, sit on a birth ball and hula- anything that's upright and putting your pelvis in open, asymmetrical positions. Spend at least 30 minutes doing this one as well to give your baby a chance to move down. If you are lunging or stair or curb walking, you should lunge/walk/go up stairs in the direction that feels better to you. The key with the lunge is that the toes of the higher leg and mom's belly button should be at right angles. Do not lunge over your knee, that closes the pelvis.  You got this!!!!!!!!!!!!!!!!!!!!!!! :)

## 2023-11-10 NOTE — MAU Note (Signed)
 Lab called regarding urine PCR

## 2023-11-10 NOTE — MAU Provider Note (Addendum)
 MAU Provider Note  Chief Complaint: Hypertension   Event Date/Time   First Provider Initiated Contact with Patient 11/10/23 1611      SUBJECTIVE HPI: Kimberly Fuentes is a 28 y.o. G1P0000 at [redacted]w[redacted]d by early ultrasound who presents to maternity admissions reporting elevated BP in office. Pregnancy c/b none. Receives [facility].  Patient presented to regular ROB visit and was found to have 2 mild range blood pressures. Notes chronic HA. Less than her usual intensity. No headache currently. Had floaters in her vision on and off for past few weeks. No CP, SOB, RUQ pain, extremity swelling. +FM. No regular contractions, urinary symptoms, vaginal bleeding, LOF, change in discharge.   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   UTI (lower urinary tract infection)    Past Surgical History:  Procedure Laterality Date   TONSILLECTOMY AND ADENOIDECTOMY  2000   WISDOM TOOTH EXTRACTION  2012   Social History   Socioeconomic History   Marital status: Single    Spouse name: Not on file   Number of children: Not on file   Years of education: Not on file   Highest education level: Not on file  Occupational History   Not on file  Tobacco Use   Smoking status: Never    Passive exposure: Yes   Smokeless tobacco: Never   Tobacco comments:    BF smokes outside   Vaping Use   Vaping status: Never Used  Substance and Sexual Activity   Alcohol use: Yes    Comment: social drinker   Drug use: Never   Sexual activity: Yes    Birth control/protection: Condom  Other Topics Concern   Not on file  Social History Narrative   Marital status/children/pets: single   Education/employment: employed   Field seismologist:      -Wears a bicycle helmet riding a bike: Yes     -smoke alarm in the home:Yes     - wears seatbelt: Yes      - Feels safe in their relationships: Yes                     Social Drivers of Corporate investment banker Strain: Not on file  Food Insecurity: Not on file  Transportation Needs: Not on file  Physical Activity: Not on file  Stress: Not on file  Social Connections: Not on file  Intimate Partner Violence: Not on file   No current facility-administered medications on file prior to encounter.   Current Outpatient Medications on File Prior to Encounter  Medication Sig Dispense Refill   Prenatal Vit-Fe Fumarate-FA (PRENATAL MULTIVITAMIN) TABS tablet Take 1 tablet by mouth daily at 12 noon.     ondansetron (ZOFRAN-ODT) 4 MG disintegrating tablet Take 1 tablet (4 mg total) by mouth every 8 (eight) hours as needed for nausea or vomiting. (Patient not taking: Reported on 06/20/2023) 20 tablet 0   Allergies  Allergen Reactions   Cefazolin Hives   Penicillins Hives   Hydrocodone Itching    Facial itching when taking after wisdom teeth extraction   Wound Dressing Adhesive Itching and Rash    ROS:  Pertinent positives/negatives listed above.  I have reviewed patient's Past Medical Hx, Surgical Hx, Family Hx, Social Hx, medications and allergies.   Physical Exam  Patient Vitals  for the past 24 hrs:  BP Temp Temp src Pulse Resp SpO2 Height Weight  11/10/23 1830 115/81 -- -- 95 -- 98 % -- --  11/10/23 1815 116/84 -- -- 93 -- 100 % -- --  11/10/23 1800 113/73 -- -- 91 -- 100 % -- --  11/10/23 1754 110/73 -- -- 90 -- -- -- --  11/10/23 1715 116/81 -- -- 100 -- 99 % -- --  11/10/23 1700 127/87 -- -- 96 -- 100 % -- --  11/10/23 1645 126/82 -- -- (!) 102 -- 99 % -- --  11/10/23 1630 117/82 -- -- 94 -- 99 % -- --  11/10/23 1615 118/87 -- -- (!) 101 -- 100 % -- --  11/10/23 1600 119/78 -- -- 98 -- 100 % -- --  11/10/23 1545 119/84 -- -- (!) 103 -- 100 % -- --  11/10/23 1530 (!) 129/90 -- -- 100 -- 99 % -- --  11/10/23 1518 138/83 98.2 F (36.8 C) Oral (!) 106 17 100 % -- --   11/10/23 1508 -- -- -- -- -- -- 5' 2.75" (1.594 m) 89.5 kg   Constitutional: Well-developed, well-nourished female in no acute distress  Cardiovascular: normal rate Respiratory: normal effort GI: Abd soft, non-tender, gravid MS: Extremities nontender, no edema, normal ROM Neurologic: Alert and oriented x 4  GU: Neg CVAT  FHT:  Baseline 135, moderate variability, accelerations present, no decelerations Contractions: UI  LAB RESULTS Results for orders placed or performed during the hospital encounter of 11/10/23 (from the past 24 hours)  Urinalysis, Routine w reflex microscopic -Urine, Clean Catch     Status: Abnormal   Collection Time: 11/10/23  3:14 PM  Result Value Ref Range   Color, Urine STRAW (A) YELLOW   APPearance HAZY (A) CLEAR   Specific Gravity, Urine 1.008 1.005 - 1.030   pH 7.0 5.0 - 8.0   Glucose, UA NEGATIVE NEGATIVE mg/dL   Hgb urine dipstick NEGATIVE NEGATIVE   Bilirubin Urine NEGATIVE NEGATIVE   Ketones, ur NEGATIVE NEGATIVE mg/dL   Protein, ur NEGATIVE NEGATIVE mg/dL   Nitrite NEGATIVE NEGATIVE   Leukocytes,Ua TRACE (A) NEGATIVE   RBC / HPF 0-5 0 - 5 RBC/hpf   WBC, UA 0-5 0 - 5 WBC/hpf   Bacteria, UA RARE (A) NONE SEEN   Squamous Epithelial / HPF 0-5 0 - 5 /HPF   Mucus PRESENT   Protein / creatinine ratio, urine     Status: Abnormal   Collection Time: 11/10/23  3:14 PM  Result Value Ref Range   Creatinine, Urine 48 mg/dL   Total Protein, Urine 10 mg/dL   Protein Creatinine Ratio 0.21 (H) 0.00 - 0.15 mg/mg[Cre]  CBC     Status: Abnormal   Collection Time: 11/10/23  4:22 PM  Result Value Ref Range   WBC 14.1 (H) 4.0 - 10.5 K/uL   RBC 4.37 3.87 - 5.11 MIL/uL   Hemoglobin 11.3 (L) 12.0 - 15.0 g/dL   HCT 65.7 (L) 84.6 - 96.2 %   MCV 81.0 80.0 - 100.0 fL   MCH 25.9 (L) 26.0 - 34.0 pg   MCHC 31.9 30.0 - 36.0 g/dL   RDW 95.2 (H) 84.1 - 32.4 %   Platelets 241 150 - 400 K/uL   nRBC 0.0 0.0 - 0.2 %  Comprehensive metabolic panel     Status: Abnormal    Collection Time: 11/10/23  4:22 PM  Result Value Ref Range   Sodium 138 135 - 145 mmol/L  Potassium 4.1 3.5 - 5.1 mmol/L   Chloride 104 98 - 111 mmol/L   CO2 24 22 - 32 mmol/L   Glucose, Bld 82 70 - 99 mg/dL   BUN 7 6 - 20 mg/dL   Creatinine, Ser 1.61 0.44 - 1.00 mg/dL   Calcium 9.3 8.9 - 09.6 mg/dL   Total Protein 6.3 (L) 6.5 - 8.1 g/dL   Albumin 3.0 (L) 3.5 - 5.0 g/dL   AST 24 15 - 41 U/L   ALT 20 0 - 44 U/L   Alkaline Phosphatase 175 (H) 38 - 126 U/L   Total Bilirubin 0.5 0.0 - 1.2 mg/dL   GFR, Estimated >04 >54 mL/min   Anion gap 10 5 - 15       IMAGING No results found.  MAU Management/MDM: Orders Placed This Encounter  Procedures   Urinalysis, Routine w reflex microscopic -Urine, Clean Catch   CBC   Comprehensive metabolic panel   Protein / creatinine ratio, urine   Discharge patient Discharge disposition: 01-Home or Self Care; Discharge patient date: 11/10/2023    No orders of the defined types were placed in this encounter.    Available prenatal records reviewed.  Patient presented to have mild range BP in office at [redacted]w[redacted]d. Asymptomatic currently. Has been normotensive so far. Obtain PreE labs. Reactive NST. Labs pending.  Care passed to Dr. Randel Buss 1700.  Authur Leghorn, MD OB Fellow 11/10/2023  6:36 PM   Results returned for PC ratio.  PC ratio of 0.21.  CBC with hemoglobin of 11.3, platelets 241.  CMP with expected values.  Patient denied any symptoms of preeclampsia.  Discussed strict return precautions and patient discharged home.  Janna Melter, MD Attending Family Medicine Physician, [facility] for [facility], [facility]

## 2023-11-18 ENCOUNTER — Telehealth (HOSPITAL_COMMUNITY): Payer: Self-pay | Admitting: *Deleted

## 2023-11-18 ENCOUNTER — Encounter (HOSPITAL_COMMUNITY): Payer: Self-pay | Admitting: *Deleted

## 2023-11-18 NOTE — H&P (Signed)
 Kimberly Fuentes is a 28 y.o. female presenting for IOL s/s gestational HTN. She had one BP elevation in office to 140s/90s. No s/s severe disease. She is RH negative but baby was RH negative on panorama so Rhogam was not given. She is a fragile X premutation carrier and genetic testing was declined. She has OCD and is not on medications for that. She takes famotidine  for reflux. She is expecting a RR boy Kimberly Fuentes").     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 Social History:  reports that she has never smoked. She has been exposed to tobacco smoke. She has never used smokeless tobacco. She reports current alcohol use. She reports that she does not use drugs.     Maternal Diabetes: No Genetic Screening: Normal Maternal Ultrasounds/Referrals: Normal Fetal Ultrasounds or other Referrals:  None Resolved CP cyst. Maternal Substance Abuse:  No Significant Maternal Medications:  None Significant Maternal Lab Results:  Group B Strep negative Number of Prenatal Visits:greater than 3 verified prenatal visits  Review of Systems History   Last menstrual period 02/03/2023. Exam Physical Exam  (from office) NAD, A&O NWOB Abd soft, nondistended, gravid  Prenatal labs: ABO, Rh: A/Negative/-- (09/24 0000) Antibody: Negative (09/24 0000) Rubella: Immune (09/24 0000) RPR: Nonreactive  (09/24 0000)  HBsAg: Negative (09/24 0000)  HIV: Non-reactive (09/24 0000)  GBS: Negative/-- (04/02 0000)   Assessment/Plan: 28 yo G1P0 @ 39.6 wga presenting for IOL s/s gestational HTN without severe features at term. Cervix unfavorable. Plan for cytotec followed by pitocin/AROM when more favorable.  GBS negative.     Kimberly Fuentes 11/18/2023, 12:06 PM

## 2023-11-18 NOTE — Telephone Encounter (Signed)
 Preadmission screen

## 2023-11-19 ENCOUNTER — Inpatient Hospital Stay (HOSPITAL_COMMUNITY)

## 2023-11-19 ENCOUNTER — Inpatient Hospital Stay (HOSPITAL_COMMUNITY)
Admission: RE | Admit: 2023-11-19 | Discharge: 2023-11-23 | DRG: 788 | Disposition: A | Discharge status: Home / Self Care | Attending: Obstetrics and Gynecology | Admitting: Obstetrics and Gynecology

## 2023-11-19 DIAGNOSIS — O134 Gestational [pregnancy-induced] hypertension without significant proteinuria, complicating childbirth: Secondary | ICD-10-CM | POA: Diagnosis not present

## 2023-11-19 DIAGNOSIS — O480 Post-term pregnancy: Secondary | ICD-10-CM | POA: Diagnosis not present

## 2023-11-19 DIAGNOSIS — Z3A40 40 weeks gestation of pregnancy: Secondary | ICD-10-CM | POA: Diagnosis not present

## 2023-11-19 DIAGNOSIS — Z3490 Encounter for supervision of normal pregnancy, unspecified, unspecified trimester: Principal | ICD-10-CM

## 2023-11-19 DIAGNOSIS — Z3A39 39 weeks gestation of pregnancy: Secondary | ICD-10-CM

## 2023-11-19 DIAGNOSIS — O9902 Anemia complicating childbirth: Secondary | ICD-10-CM | POA: Diagnosis not present

## 2023-11-19 DIAGNOSIS — O368930 Maternal care for other specified fetal problems, third trimester, not applicable or unspecified: Secondary | ICD-10-CM | POA: Diagnosis not present

## 2023-11-19 DIAGNOSIS — Z881 Allergy status to other antibiotic agents status: Secondary | ICD-10-CM | POA: Diagnosis not present

## 2023-11-19 LAB — COMPREHENSIVE METABOLIC PANEL WITH GFR
ALT: 18 U/L (ref 0–44)
AST: 29 U/L (ref 15–41)
Albumin: 3 g/dL — ABNORMAL LOW (ref 3.5–5.0)
Alkaline Phosphatase: 174 U/L — ABNORMAL HIGH (ref 38–126)
Anion gap: 11 (ref 5–15)
BUN: 8 mg/dL (ref 6–20)
CO2: 22 mmol/L (ref 22–32)
Calcium: 9.6 mg/dL (ref 8.9–10.3)
Chloride: 103 mmol/L (ref 98–111)
Creatinine, Ser: 0.84 mg/dL (ref 0.44–1.00)
GFR, Estimated: >60 mL/min (ref >60)
Glucose, Bld: 82 mg/dL (ref 70–99)
Potassium: 4.4 mmol/L (ref 3.5–5.1)
Sodium: 136 mmol/L (ref 135–145)
Total Bilirubin: 0.8 mg/dL (ref 0.0–1.2)
Total Protein: 5.8 g/dL — ABNORMAL LOW (ref 6.5–8.1)

## 2023-11-19 LAB — CBC
HCT: 34.7 % — ABNORMAL LOW (ref 36.0–46.0)
Hemoglobin: 11.4 g/dL — ABNORMAL LOW (ref 12.0–15.0)
MCH: 26.4 pg (ref 26.0–34.0)
MCHC: 32.9 g/dL (ref 30.0–36.0)
MCV: 80.3 fL (ref 80.0–100.0)
Platelets: 254 10*3/uL (ref 150–400)
RBC: 4.32 MIL/uL (ref 3.87–5.11)
RDW: 16.6 % — ABNORMAL HIGH (ref 11.5–15.5)
WBC: 13.8 10*3/uL — ABNORMAL HIGH (ref 4.0–10.5)
nRBC: 0 % (ref 0.0–0.2)

## 2023-11-19 LAB — TYPE AND SCREEN
ABO/RH(D): A NEG
Antibody Screen: NEGATIVE

## 2023-11-19 MED ORDER — ACETAMINOPHEN 325 MG PO TABS: 650.0000 mg | ORAL_TABLET | ORAL | Status: DC | PRN

## 2023-11-19 MED ORDER — LIDOCAINE HCL (PF) 1 % IJ SOLN: 30.0000 mL | INTRAMUSCULAR | Status: DC | PRN

## 2023-11-19 MED ORDER — OXYCODONE-ACETAMINOPHEN 5-325 MG PO TABS: 2.0000 | ORAL_TABLET | ORAL | Status: DC | PRN

## 2023-11-19 MED ORDER — OXYCODONE-ACETAMINOPHEN 5-325 MG PO TABS: 1.0000 | ORAL_TABLET | ORAL | Status: DC | PRN

## 2023-11-19 MED ORDER — OXYTOCIN-SODIUM CHLORIDE 30-0.9 UT/500ML-% IV SOLN
1.0000 m[IU]/min | INTRAVENOUS | Status: DC
  Administered 2023-11-20: 2 m[IU]/min via INTRAVENOUS
  Filled 2023-11-19: qty 500

## 2023-11-19 MED ORDER — LACTATED RINGERS IV SOLN
500.0000 mL | Freq: Once | INTRAVENOUS | Status: AC
  Administered 2023-11-20: 500 mL via INTRAVENOUS

## 2023-11-19 MED ORDER — LACTATED RINGERS IV SOLN: 500.0000 mL | INTRAVENOUS | Status: AC | PRN

## 2023-11-19 MED ORDER — MISOPROSTOL 25 MCG QUARTER TABLET
25.0000 ug | ORAL_TABLET | Freq: Once | ORAL | Status: AC
  Administered 2023-11-19: 25 ug via BUCCAL

## 2023-11-19 MED ORDER — TERBUTALINE SULFATE 1 MG/ML IJ SOLN: 0.2500 mg | Freq: Once | INTRAMUSCULAR | Status: DC | PRN

## 2023-11-19 MED ORDER — HYDROXYZINE HCL 50 MG PO TABS
50.0000 mg | ORAL_TABLET | Freq: Four times a day (QID) | ORAL | Status: DC | PRN
  Administered 2023-11-19: 50 mg via ORAL
  Filled 2023-11-19: qty 1

## 2023-11-19 MED ORDER — MISOPROSTOL 25 MCG QUARTER TABLET
25.0000 ug | ORAL_TABLET | ORAL | Status: DC | PRN
  Administered 2023-11-19 (×2): 25 ug via VAGINAL
  Filled 2023-11-19 (×3): qty 1

## 2023-11-19 MED ORDER — PHENYLEPHRINE 80 MCG/ML (10ML) SYRINGE FOR IV PUSH (FOR BLOOD PRESSURE SUPPORT): 80.0000 ug | PREFILLED_SYRINGE | INTRAVENOUS | Status: DC | PRN

## 2023-11-19 MED ORDER — LACTATED RINGERS IV SOLN
INTRAVENOUS | Status: AC
  Administered 2023-11-19: 125 mL/h via INTRAVENOUS

## 2023-11-19 MED ORDER — ZOLPIDEM TARTRATE 5 MG PO TABS: 5.0000 mg | ORAL_TABLET | Freq: Every evening | ORAL | Status: DC | PRN

## 2023-11-19 MED ORDER — SOD CITRATE-CITRIC ACID 500-334 MG/5ML PO SOLN
30.0000 mL | ORAL | Status: DC | PRN
  Filled 2023-11-19: qty 30

## 2023-11-19 MED ORDER — DIPHENHYDRAMINE HCL 50 MG/ML IJ SOLN: 12.5000 mg | INTRAMUSCULAR | Status: DC | PRN

## 2023-11-19 MED ORDER — FENTANYL-BUPIVACAINE-NACL 0.5-0.125-0.9 MG/250ML-% EP SOLN
12.0000 mL/h | EPIDURAL | Status: DC | PRN
  Administered 2023-11-20: 12 mL/h via EPIDURAL
  Filled 2023-11-19: qty 250

## 2023-11-19 MED ORDER — OXYTOCIN BOLUS FROM INFUSION: 333.0000 mL | Freq: Once | INTRAVENOUS | Status: DC

## 2023-11-19 MED ORDER — EPHEDRINE 5 MG/ML INJ: 10.0000 mg | INTRAVENOUS | Status: DC | PRN

## 2023-11-19 MED ORDER — FENTANYL CITRATE (PF) 100 MCG/2ML IJ SOLN
50.0000 ug | INTRAMUSCULAR | Status: DC | PRN
Start: 2023-11-19 — End: 2023-11-20

## 2023-11-19 MED ORDER — OXYTOCIN-SODIUM CHLORIDE 30-0.9 UT/500ML-% IV SOLN: 2.5000 [IU]/h | INTRAVENOUS | Status: DC

## 2023-11-19 MED ORDER — ONDANSETRON HCL 4 MG/2ML IJ SOLN: 4.0000 mg | Freq: Four times a day (QID) | INTRAMUSCULAR | Status: DC | PRN

## 2023-11-19 NOTE — Progress Notes (Signed)
 S/p cytotec x 2. Next due at ~9pm Bps stable.   Anticipated IOl process dw pt and family.  Hortense Lyons MD

## 2023-11-19 NOTE — Progress Notes (Signed)
 NO updates to H&P. Arrived for IOL.  SVE closed/60/-3 For cytotec   Hortense Lyons MD

## 2023-11-20 ENCOUNTER — Other Ambulatory Visit: Payer: Self-pay

## 2023-11-20 ENCOUNTER — Encounter (HOSPITAL_COMMUNITY): Payer: Self-pay | Admitting: Obstetrics and Gynecology

## 2023-11-20 ENCOUNTER — Inpatient Hospital Stay (HOSPITAL_COMMUNITY): Admitting: Anesthesiology

## 2023-11-20 ENCOUNTER — Encounter (HOSPITAL_COMMUNITY)
Admission: RE | Disposition: A | Discharge status: Home / Self Care | Payer: Self-pay | Source: Home / Self Care | Attending: Obstetrics and Gynecology

## 2023-11-20 DIAGNOSIS — O480 Post-term pregnancy: Secondary | ICD-10-CM

## 2023-11-20 DIAGNOSIS — Z3A40 40 weeks gestation of pregnancy: Secondary | ICD-10-CM

## 2023-11-20 DIAGNOSIS — O134 Gestational [pregnancy-induced] hypertension without significant proteinuria, complicating childbirth: Secondary | ICD-10-CM

## 2023-11-20 LAB — CBC
HCT: 29.2 % — ABNORMAL LOW (ref 36.0–46.0)
Hemoglobin: 9.4 g/dL — ABNORMAL LOW (ref 12.0–15.0)
MCH: 26.4 pg (ref 26.0–34.0)
MCHC: 32.2 g/dL (ref 30.0–36.0)
MCV: 82 fL (ref 80.0–100.0)
Platelets: 195 10*3/uL (ref 150–400)
RBC: 3.56 MIL/uL — ABNORMAL LOW (ref 3.87–5.11)
RDW: 16.6 % — ABNORMAL HIGH (ref 11.5–15.5)
WBC: 12.8 10*3/uL — ABNORMAL HIGH (ref 4.0–10.5)
nRBC: 0 % (ref 0.0–0.2)

## 2023-11-20 LAB — RPR: RPR Ser Ql: NONREACTIVE

## 2023-11-20 SURGERY — Surgical Case
Anesthesia: Epidural | Site: Abdomen

## 2023-11-20 MED ORDER — GENTAMICIN SULFATE 40 MG/ML IJ SOLN
5.0000 mg/kg | INTRAVENOUS | Status: AC
  Administered 2023-11-20: 334.4 mg via INTRAVENOUS
  Filled 2023-11-20: qty 8.25

## 2023-11-20 MED ORDER — NALOXONE HCL 4 MG/10ML IJ SOLN: 1.0000 ug/kg/h | INTRAVENOUS | Status: DC | PRN

## 2023-11-20 MED ORDER — DIPHENHYDRAMINE HCL 50 MG/ML IJ SOLN
12.5000 mg | INTRAMUSCULAR | Status: DC | PRN
  Administered 2023-11-21: 12.5 mg via INTRAVENOUS
  Filled 2023-11-20: qty 1

## 2023-11-20 MED ORDER — ACETAMINOPHEN 500 MG PO TABS: 1000.0000 mg | ORAL_TABLET | Freq: Four times a day (QID) | ORAL | Status: DC

## 2023-11-20 MED ORDER — EPHEDRINE 5 MG/ML INJ
INTRAVENOUS | Status: AC
  Filled 2023-11-20: qty 5

## 2023-11-20 MED ORDER — HYDROMORPHONE HCL 1 MG/ML IJ SOLN: 0.2000 mg | INTRAMUSCULAR | Status: DC | PRN

## 2023-11-20 MED ORDER — KETOROLAC TROMETHAMINE 30 MG/ML IJ SOLN
30.0000 mg | Freq: Four times a day (QID) | INTRAMUSCULAR | Status: AC
  Administered 2023-11-20 – 2023-11-21 (×3): 30 mg via INTRAVENOUS
  Filled 2023-11-20 (×3): qty 1

## 2023-11-20 MED ORDER — LACTATED RINGERS IV SOLN: INTRAVENOUS | Status: DC

## 2023-11-20 MED ORDER — WITCH HAZEL-GLYCERIN EX PADS: 1.0000 | MEDICATED_PAD | CUTANEOUS | Status: DC | PRN

## 2023-11-20 MED ORDER — IBUPROFEN 600 MG PO TABS
600.0000 mg | ORAL_TABLET | Freq: Four times a day (QID) | ORAL | Status: DC
  Administered 2023-11-21 – 2023-11-23 (×9): 600 mg via ORAL
  Filled 2023-11-20 (×9): qty 1

## 2023-11-20 MED ORDER — OXYTOCIN-SODIUM CHLORIDE 30-0.9 UT/500ML-% IV SOLN
2.5000 [IU]/h | INTRAVENOUS | Status: AC
  Administered 2023-11-20: 2.5 [IU]/h via INTRAVENOUS
  Filled 2023-11-20: qty 500

## 2023-11-20 MED ORDER — NALOXONE HCL 0.4 MG/ML IJ SOLN: 0.4000 mg | INTRAMUSCULAR | Status: DC | PRN

## 2023-11-20 MED ORDER — MORPHINE SULFATE (PF) 0.5 MG/ML IJ SOLN
INTRAMUSCULAR | Status: AC
  Filled 2023-11-20: qty 10

## 2023-11-20 MED ORDER — DIPHENHYDRAMINE HCL 25 MG PO CAPS: 25.0000 mg | ORAL_CAPSULE | ORAL | Status: DC | PRN

## 2023-11-20 MED ORDER — ONDANSETRON HCL 4 MG/2ML IJ SOLN
INTRAMUSCULAR | Status: DC | PRN
  Administered 2023-11-20: 4 mg via INTRAVENOUS

## 2023-11-20 MED ORDER — CLINDAMYCIN PHOSPHATE 900 MG/50ML IV SOLN
900.0000 mg | INTRAVENOUS | Status: AC
  Administered 2023-11-20: 900 mg via INTRAVENOUS

## 2023-11-20 MED ORDER — PHENYLEPHRINE HCL-NACL 20-0.9 MG/250ML-% IV SOLN
INTRAVENOUS | Status: DC | PRN
  Administered 2023-11-20: 50 ug/min via INTRAVENOUS

## 2023-11-20 MED ORDER — FENTANYL CITRATE (PF) 100 MCG/2ML IJ SOLN
INTRAMUSCULAR | Status: AC
  Filled 2023-11-20: qty 2

## 2023-11-20 MED ORDER — SIMETHICONE 80 MG PO CHEW: 80.0000 mg | CHEWABLE_TABLET | ORAL | Status: DC | PRN

## 2023-11-20 MED ORDER — OXYTOCIN-SODIUM CHLORIDE 30-0.9 UT/500ML-% IV SOLN
INTRAVENOUS | Status: DC | PRN
  Administered 2023-11-20: 30 [IU] via INTRAVENOUS

## 2023-11-20 MED ORDER — SODIUM CHLORIDE 0.9 % IV SOLN
500.0000 mg | INTRAVENOUS | Status: AC
  Administered 2023-11-20: 500 mg via INTRAVENOUS

## 2023-11-20 MED ORDER — LIDOCAINE-EPINEPHRINE (PF) 2 %-1:200000 IJ SOLN
INTRAMUSCULAR | Status: AC
  Filled 2023-11-20: qty 20

## 2023-11-20 MED ORDER — TRANEXAMIC ACID-NACL 1000-0.7 MG/100ML-% IV SOLN
1000.0000 mg | Freq: Once | INTRAVENOUS | Status: AC
  Administered 2023-11-20: 1000 mg via INTRAVENOUS

## 2023-11-20 MED ORDER — LIDOCAINE HCL (PF) 1 % IJ SOLN
INTRAMUSCULAR | Status: DC | PRN
  Administered 2023-11-20 (×2): 5 mL via EPIDURAL

## 2023-11-20 MED ORDER — ONDANSETRON HCL 4 MG/2ML IJ SOLN
INTRAMUSCULAR | Status: AC
  Filled 2023-11-20: qty 2

## 2023-11-20 MED ORDER — PRENATAL MULTIVITAMIN CH
1.0000 | ORAL_TABLET | Freq: Every day | ORAL | Status: DC
  Administered 2023-11-21 – 2023-11-23 (×3): 1 via ORAL
  Filled 2023-11-20 (×3): qty 1

## 2023-11-20 MED ORDER — ONDANSETRON HCL 4 MG/2ML IJ SOLN: 4.0000 mg | Freq: Three times a day (TID) | INTRAMUSCULAR | Status: DC | PRN

## 2023-11-20 MED ORDER — OXYCODONE HCL 5 MG PO TABS
5.0000 mg | ORAL_TABLET | ORAL | Status: DC | PRN
  Administered 2023-11-21: 5 mg via ORAL
  Filled 2023-11-20: qty 1

## 2023-11-20 MED ORDER — DIPHENHYDRAMINE HCL 25 MG PO CAPS: 25.0000 mg | ORAL_CAPSULE | Freq: Four times a day (QID) | ORAL | Status: DC | PRN

## 2023-11-20 MED ORDER — PHENYLEPHRINE 80 MCG/ML (10ML) SYRINGE FOR IV PUSH (FOR BLOOD PRESSURE SUPPORT)
PREFILLED_SYRINGE | INTRAVENOUS | Status: DC | PRN
  Administered 2023-11-20: 80 ug via INTRAVENOUS

## 2023-11-20 MED ORDER — KETOROLAC TROMETHAMINE 30 MG/ML IJ SOLN: 30.0000 mg | Freq: Four times a day (QID) | INTRAMUSCULAR | Status: AC | PRN

## 2023-11-20 MED ORDER — SOD CITRATE-CITRIC ACID 500-334 MG/5ML PO SOLN
30.0000 mL | ORAL | Status: AC
  Administered 2023-11-20: 30 mL via ORAL

## 2023-11-20 MED ORDER — MORPHINE SULFATE (PF) 0.5 MG/ML IJ SOLN
INTRAMUSCULAR | Status: DC | PRN
  Administered 2023-11-20: 3 mg via EPIDURAL

## 2023-11-20 MED ORDER — DIBUCAINE (PERIANAL) 1 % EX OINT: 1.0000 | TOPICAL_OINTMENT | CUTANEOUS | Status: DC | PRN

## 2023-11-20 MED ORDER — SCOPOLAMINE 1 MG/3DAYS TD PT72: 1.0000 | MEDICATED_PATCH | Freq: Once | TRANSDERMAL | Status: DC

## 2023-11-20 MED ORDER — SODIUM CHLORIDE 0.9% FLUSH: 3.0000 mL | INTRAVENOUS | Status: DC | PRN

## 2023-11-20 MED ORDER — CLINDAMYCIN PHOSPHATE 900 MG/50ML IV SOLN
INTRAVENOUS | Status: AC
  Filled 2023-11-20: qty 50

## 2023-11-20 MED ORDER — DEXAMETHASONE SODIUM PHOSPHATE 10 MG/ML IJ SOLN
INTRAMUSCULAR | Status: DC | PRN
  Administered 2023-11-20 (×2): 5 mg via INTRAVENOUS

## 2023-11-20 MED ORDER — STERILE WATER FOR IRRIGATION IR SOLN
Status: DC | PRN
  Administered 2023-11-20: 1000 mL

## 2023-11-20 MED ORDER — DEXMEDETOMIDINE HCL IN NACL 80 MCG/20ML IV SOLN
INTRAVENOUS | Status: AC
  Filled 2023-11-20: qty 20

## 2023-11-20 MED ORDER — SENNOSIDES-DOCUSATE SODIUM 8.6-50 MG PO TABS
2.0000 | ORAL_TABLET | Freq: Every day | ORAL | Status: DC
  Administered 2023-11-21 – 2023-11-22 (×2): 2 via ORAL
  Filled 2023-11-20 (×3): qty 2

## 2023-11-20 MED ORDER — COCONUT OIL OIL
1.0000 | TOPICAL_OIL | Status: DC | PRN
  Administered 2023-11-22: 1 via TOPICAL

## 2023-11-20 MED ORDER — DEXAMETHASONE SODIUM PHOSPHATE 10 MG/ML IJ SOLN: INTRAMUSCULAR | Status: DC | PRN

## 2023-11-20 MED ORDER — SCOPOLAMINE 1 MG/3DAYS TD PT72
MEDICATED_PATCH | TRANSDERMAL | Status: AC
  Filled 2023-11-20: qty 4

## 2023-11-20 MED ORDER — FENTANYL CITRATE (PF) 100 MCG/2ML IJ SOLN
INTRAMUSCULAR | Status: DC | PRN
  Administered 2023-11-20: 100 ug via EPIDURAL

## 2023-11-20 MED ORDER — SIMETHICONE 80 MG PO CHEW
80.0000 mg | CHEWABLE_TABLET | Freq: Three times a day (TID) | ORAL | Status: DC
  Administered 2023-11-21 – 2023-11-23 (×6): 80 mg via ORAL
  Filled 2023-11-20 (×6): qty 1

## 2023-11-20 MED ORDER — ZOLPIDEM TARTRATE 5 MG PO TABS: 5.0000 mg | ORAL_TABLET | Freq: Every evening | ORAL | Status: DC | PRN

## 2023-11-20 MED ORDER — DEXMEDETOMIDINE HCL IN NACL 80 MCG/20ML IV SOLN
INTRAVENOUS | Status: DC | PRN
  Administered 2023-11-20: 12 ug via INTRAVENOUS

## 2023-11-20 MED ORDER — SODIUM CHLORIDE 0.9 % IR SOLN
Status: DC | PRN
  Administered 2023-11-20: 1

## 2023-11-20 MED ORDER — ACETAMINOPHEN 10 MG/ML IV SOLN
INTRAVENOUS | Status: AC
  Filled 2023-11-20: qty 100

## 2023-11-20 MED ORDER — BUPIVACAINE IN DEXTROSE 0.75-8.25 % IT SOLN
INTRATHECAL | Status: AC
Start: 2023-11-20 — End: ?
  Filled 2023-11-20: qty 4

## 2023-11-20 MED ORDER — ACETAMINOPHEN 500 MG PO TABS
1000.0000 mg | ORAL_TABLET | Freq: Four times a day (QID) | ORAL | Status: DC
  Administered 2023-11-20 – 2023-11-23 (×12): 1000 mg via ORAL
  Filled 2023-11-20 (×12): qty 2

## 2023-11-20 MED ORDER — DEXAMETHASONE SODIUM PHOSPHATE 10 MG/ML IJ SOLN
INTRAMUSCULAR | Status: AC
  Filled 2023-11-20: qty 1

## 2023-11-20 MED ORDER — ACETAMINOPHEN 10 MG/ML IV SOLN
INTRAVENOUS | Status: DC | PRN
  Administered 2023-11-20: 1000 mg via INTRAVENOUS

## 2023-11-20 MED ORDER — MENTHOL 3 MG MT LOZG: 1.0000 | LOZENGE | OROMUCOSAL | Status: DC | PRN

## 2023-11-20 MED ORDER — SODIUM BICARBONATE 8.4 % IV SOLN
INTRAVENOUS | Status: DC | PRN
  Administered 2023-11-20: 5 mL via EPIDURAL
  Administered 2023-11-20: 3 mL via EPIDURAL

## 2023-11-20 SURGICAL SUPPLY — 28 items
BENZOIN TINCTURE PRP APPL 2/3 (GAUZE/BANDAGES/DRESSINGS) ×1 IMPLANT
CHLORAPREP W/TINT 26 (MISCELLANEOUS) ×2 IMPLANT
CLAMP UMBILICAL CORD (MISCELLANEOUS) ×1 IMPLANT
CLOTH BEACON ORANGE TIMEOUT ST (SAFETY) ×1 IMPLANT
CLSR STERI-STRIP ANTIMIC 1/2X4 (GAUZE/BANDAGES/DRESSINGS) ×1 IMPLANT
DRSG OPSITE POSTOP 4X10 (GAUZE/BANDAGES/DRESSINGS) ×1 IMPLANT
ELECTRODE REM PT RTRN 9FT ADLT (ELECTROSURGICAL) ×1 IMPLANT
EXTRACTOR VACUUM KIWI (MISCELLANEOUS) IMPLANT
GAUZE SPONGE 4X4 12PLY STRL LF (GAUZE/BANDAGES/DRESSINGS) IMPLANT
GLOVE BIO SURGEON STRL SZ 6.5 (GLOVE) ×1 IMPLANT
GLOVE BIOGEL PI IND STRL 6.5 (GLOVE) ×1 IMPLANT
GLOVE BIOGEL PI IND STRL 7.0 (GLOVE) ×2 IMPLANT
GOWN STRL REUS W/TWL LRG LVL3 (GOWN DISPOSABLE) ×2 IMPLANT
KIT ABG SYR 3ML LUER SLIP (SYRINGE) ×1 IMPLANT
MAT PREVALON FULL STRYKER (MISCELLANEOUS) IMPLANT
NDL HYPO 25X5/8 SAFETYGLIDE (NEEDLE) ×1 IMPLANT
NEEDLE HYPO 25X5/8 SAFETYGLIDE (NEEDLE) ×1 IMPLANT
NS IRRIG 1000ML POUR BTL (IV SOLUTION) ×1 IMPLANT
PACK C SECTION WH (CUSTOM PROCEDURE TRAY) ×1 IMPLANT
PAD OB MATERNITY 4.3X12.25 (PERSONAL CARE ITEMS) ×1 IMPLANT
SUT PLAIN 0 NONE (SUTURE) IMPLANT
SUT PLAIN ABS 2-0 CT1 27XMFL (SUTURE) ×1 IMPLANT
SUT VIC AB 0 CT1 36 (SUTURE) ×1 IMPLANT
SUT VIC AB 0 CTX36XBRD ANBCTRL (SUTURE) ×2 IMPLANT
SUT VIC AB 4-0 PS2 27 (SUTURE) ×1 IMPLANT
TOWEL OR 17X24 6PK STRL BLUE (TOWEL DISPOSABLE) ×1 IMPLANT
TRAY FOLEY W/BAG SLVR 14FR LF (SET/KITS/TRAYS/PACK) IMPLANT
WATER STERILE IRR 1000ML POUR (IV SOLUTION) ×1 IMPLANT

## 2023-11-20 NOTE — Op Note (Signed)
 PROCEDURE DATE: 11/20/23   PREOPERATIVE DIAGNOSIS: nrFHT remote from delivery, OP presentation, thick, particulate meconium   POSTOPERATIVE DIAGNOSIS: The same, tight nuchal x 2, true knot in cord   PROCEDURE:    Primary Low Transverse Cesarean Section   SURGEON:  Dr. Leighton Punches  ASSISTANT: Dr. Victorino Grates  An experienced assistant was required given the standard of surgical care given the complexity of the case.  This assistant was needed for exposure, dissection, suctioning, retraction, instrument exchange, assisting with delivery with administration of fundal pressure, and for overall help during the procedure.    INDICATIONS: This is a 28 yo G1 at 40.0 wga requiring cesarean section secondary to nonreassuring FHT remote from delivery.  Was induced s/s gestational HTN. Progressed to 4cm. However, no progress after 4 hours at 4cm. Pitocin unable to be titrated s/s nr FHT. OP presentation. Had very thick, copious, particulate meconium.  Decision made to proceed with LTCS. The risks of cesarean section discussed with the patient included but were not limited to: bleeding which may require transfusion or reoperation; infection which may require antibiotics; injury to bowel, bladder, ureters or other surrounding organs; injury to the fetus; need for additional procedures including hysterectomy in the event of a life-threatening hemorrhage; placental abnormalities wth subsequent pregnancies, incisional problems, thromboembolic phenomenon and other postoperative/anesthesia complications. The patient agreed with the proposed plan, giving informed consent for the procedure.     FINDINGS:  Viable female infant in vertex presentation, APGARs pending,  Weight pending, Amniotic fluid thick, particulate meconium,  Intact placenta, three vessel cord with true knot.  Grossly normal uterus. .   ANESTHESIA:    Epidural ESTIMATED BLOOD LOSS: See Anesthesia record SPECIMENS: Placenta for  pathology COMPLICATIONS: None immediate   PROCEDURE IN DETAIL:  The patient received intravenous antibiotics and had sequential compression devices applied to her lower extremities while in the preoperative area.  She was then taken to the operating room where epidural anesthesia was dosed up to surgical level and was found to be adequate. She was then placed in a dorsal supine position with a leftward tilt, and prepped and draped in a sterile manner.  A foley catheter was placed into her bladder and attached to constant gravity.  After an adequate timeout was performed, a Pfannenstiel skin incision was made with scalpel and carried through to the underlying layer of fascia. The fascia was incised in the midline and this incision was extended bilaterally using the Mayo scissors. Kocher clamps were applied to the superior aspect of the fascial incision and the underlying rectus muscles were dissected off bluntly. A similar process was carried out on the inferior aspect of the facial incision. The rectus muscles were separated in the midline bluntly and the peritoneum was entered bluntly.  A bladder flap was created sharply and developed bluntly. A transverse hysterotomy was made with a scalpel and extended bilaterally bluntly. The bladder blade was then removed. The infant was successfully delivered, and cord was clamped and cut and infant was handed over to awaiting neonatology team. Uterine massage was then administered and the placenta delivered intact with three-vessel cord. Cord gases were taken. The uterus was cleared of clot and debris.  The hysterotomy was closed with 0 vicryl.  A second imbricating suture of 0-vicryl was used to reinforce the incision and aid in hemostasis.The fascia was closed with 0-Vicryl in a running fashion with good restoration of anatomy.  The subcutaneus tissue was irrigated and was reapproximated using three interrupted plain gut stitches.  The skin was closed with 4-0 Vicryl in  a subcuticular fashion.  All surgical site and was hemostatic at end of procedure) without any further bleeding on exam.   It's a boy - "Orvil Bland"!!    Pt tolerated the procedure well. All sponge/lap/needle counts were correct  X 2. Pt taken to recovery room in stable condition.     Leighton Punches MD

## 2023-11-20 NOTE — Progress Notes (Signed)
 FHT improved - recurrent EARLY decels.

## 2023-11-20 NOTE — Anesthesia Preprocedure Evaluation (Addendum)
 Anesthesia Evaluation  Patient identified by MRN, date of birth, ID band Patient awake    Reviewed: Allergy & Precautions, Patient's Chart, lab work & pertinent test results  Airway Mallampati: II       Dental no notable dental hx.    Pulmonary neg pulmonary ROS   Pulmonary exam normal        Cardiovascular negative cardio ROS Normal cardiovascular exam Rhythm:Regular     Neuro/Psych  Headaches PSYCHIATRIC DISORDERS Anxiety Depression       GI/Hepatic Neg liver ROS,,,IBS   Endo/Other  negative endocrine ROS    Renal/GU negative Renal ROS  negative genitourinary   Musculoskeletal negative musculoskeletal ROS (+)    Abdominal   Peds  Hematology  (+) Blood dyscrasia, anemia   Anesthesia Other Findings   Reproductive/Obstetrics (+) Pregnancy                             Anesthesia Physical Anesthesia Plan  ASA: 2 and emergent  Anesthesia Plan: Epidural   Post-op Pain Management:    Induction:   PONV Risk Score and Plan: 2 and Scopolamine patch - Pre-op, Dexamethasone  and Ondansetron   Airway Management Planned: Natural Airway  Additional Equipment: None and Fetal Monitoring  Intra-op Plan:   Post-operative Plan:   Informed Consent: I have reviewed the patients History and Physical, chart, labs and discussed the procedure including the risks, benefits and alternatives for the proposed anesthesia with the patient or authorized representative who has indicated his/her understanding and acceptance.       Plan Discussed with: Anesthesiologist  Anesthesia Plan Comments: (Epidural to C-section for non-reassuring FHTs.)        Anesthesia Quick Evaluation

## 2023-11-20 NOTE — Progress Notes (Signed)
 SVE 1/80/-2 Start pitocin

## 2023-11-20 NOTE — Lactation Note (Addendum)
 This note was copied from a baby's chart. Lactation Consultation Note  Patient Name: Boy Kritika Lapole [id number] Date: 11/20/2023 Age:29 hours Reason for consult: Follow-up assessment;Mother's request;Primapara;1st time breastfeeding;Term;Breastfeeding assistance;RN request  P1- MOB requested for LC to assist with infant's 1700 feeding. Per MOB, latching infant to the left breast is easier for her than the right. MOB asked for infant to be latched to the left breast first and in the football hold. Infant was placed in this position per request. MOB then requested for LC to demonstrate hand expression so she could see her colostrum. LC was able to express colostrum from the left breast after a few expressions. Infant was able to latch, but he was slightly uncoordinated (chomping). Infant nursed on and off for 15 minutes with no pain or discomfort to MOB. MOB then decided to hand infant to FOB for a bottle feeding. LC reviewed pace feeding and had to demonstrate how to push the bottle nipple into infant's mouth so he wasn't only suckling on the tip. MOB denied having further questions or concerns.  LC reviewed the first 24 hr birthday nap, day 2 cluster feeding, when to expect her mature milk in, the difference between colostrum vs mature milk, feeding infant on cue 8-12x in 24 hrs, not allowing infant to go over 3 hrs without a feeding, CDC milk storage guidelines, LC services handout and engorgement/breast acre. LC encouraged MOB to call for further assistance as needed.  Maternal Data Has patient been taught Hand Expression?: Yes Does the patient have breastfeeding experience prior to this delivery?: No  Feeding Mother's Current Feeding Choice: Breast Milk and Formula Nipple Type: Slow - flow  LATCH Score Latch: Repeated attempts needed to sustain latch, nipple held in mouth throughout feeding, stimulation needed to elicit sucking reflex.  Audible Swallowing: None  Type of Nipple: Everted  at rest and after stimulation  Comfort (Breast/Nipple): Soft / non-tender  Hold (Positioning): Full assist, staff holds infant at breast  LATCH Score: 5   Lactation Tools Discussed/Used Tools: Pump;Flanges Flange Size: 18 Breast pump type: Manual Pump Education: Milk Storage;Setup, frequency, and cleaning Reason for Pumping: MOB request Pumping frequency: 15-20 min every 3 hrs as needed  Interventions Interventions: Breast feeding basics reviewed;Assisted with latch;Hand express;Breast compression;Adjust position;Support pillows;Position options;Hand pump;Education;Pace feeding;LC Services brochure  Discharge Discharge Education: Engorgement and breast care;Warning signs for feeding baby Pump: Manual;Hands Free;Personal  Consult Status Consult Status: Follow-up Date: 11/21/23 Follow-up type: In-patient    Vernette Goo BS, IBCLC 11/20/2023, 5:56 PM

## 2023-11-20 NOTE — Lactation Note (Signed)
 This note was copied from a baby's chart. Lactation Consultation Note  Patient Name: Kimberly Fuentes Today's Date: 11/20/2023 Age:29 hours Reason for consult: Initial assessment;1st time breastfeeding;Primapara  P1, 40 wks, @ 3 hrs of life. Infant in football position/ latching with bedside RN with LC arrival. LC takes over- demonstrates starting with hand expression- good colostrum seen. Demonstrated steps oflatching, first few latches mom C/O discomfort. Demonstrated breaking latch and re-latching. Reassured baby learning how to work mouth. Infant does well @ breast, works on both sides- 25 minutes overall. Discussed expectations @ breast- Day 1- sleepy/ feed every 3 hrs/ even 10 minutes is okay, Day 2 more awake/ feeding cues/longer feeds, and cluster feeding overnights brings milk in. Highlighted breast stimulation is tied directly to milk production. Discussed hands on breast and baby, keeping baby awake @ breast. Starting with hand expression & breast compression to get baby working @ breast, and gentle stimulation to keep baby working @ breast.  LC services and milk storage shared. Encouraged mom to call for assist anytime desired.  Maternal Data Has patient been taught Hand Expression?: Yes Does the patient have breastfeeding experience prior to this delivery?: No  Feeding Mother's Current Feeding Choice: Breast Milk  LATCH Score Latch: Grasps breast easily, tongue down, lips flanged, rhythmical sucking.  Audible Swallowing: Spontaneous and intermittent  Type of Nipple: Everted at rest and after stimulation  Comfort (Breast/Nipple): Soft / non-tender  Hold (Positioning): Full assist, staff holds infant at breast  LATCH Score: 8   Lactation Tools Discussed/Used    Interventions Interventions: Breast feeding basics reviewed;Assisted with latch;Hand express;Breast compression;Adjust position;Education;LC Services brochure;CDC milk storage guidelines  Discharge Pump:  Hands Free;Personal;Manual  Consult Status Consult Status: Follow-up Date: 11/21/23 Follow-up type: In-patient    Corin Tilly 11/20/2023, 2:14 PM

## 2023-11-20 NOTE — Anesthesia Procedure Notes (Signed)
 Epidural Patient location during procedure: OB Start time: 11/20/2023 1:54 AM End time: 11/20/2023 2:03 AM  Staffing Anesthesiologist: Tura Gaines, MD Performed: anesthesiologist   Preanesthetic Checklist Completed: patient identified, IV checked, site marked, risks and benefits discussed, surgical consent, monitors and equipment checked, pre-op evaluation and timeout performed  Epidural Patient position: sitting Prep: DuraPrep and site prepped and draped Patient monitoring: continuous pulse ox and blood pressure Approach: midline Location: L3-L4 Injection technique: LOR air  Needle:  Needle type: Tuohy  Needle gauge: 17 G Needle length: 9 cm and 9 Needle insertion depth: 5 cm Catheter type: closed end flexible Catheter size: 19 Gauge Catheter at skin depth: 10 cm Test dose: negative and Other  Assessment Events: blood not aspirated, no cerebrospinal fluid, injection not painful, no injection resistance, no paresthesia and negative IV test  Additional Notes Patient identified. Risks and benefits discussed including failed block, incomplete  Pain control, post dural puncture headache, nerve damage, paralysis, blood pressure Changes, nausea, vomiting, reactions to medications-both toxic and allergic and post Partum back pain. All questions were answered. Patient expressed understanding and wished to proceed. Sterile technique was used throughout procedure. Epidural site was Dressed with sterile barrier dressing. No paresthesias, signs of intravascular injection Or signs of intrathecal spread were encountered.  Patient was more comfortable after the epidural was dosed. Please see RN's note for documentation of vital signs and FHR which are stable. Reason for block:procedure for pain

## 2023-11-20 NOTE — Transfer of Care (Signed)
 Immediate Anesthesia Transfer of Care Note  Patient: Kimberly Fuentes  Procedure(s) Performed: CESAREAN DELIVERY (Abdomen)  Patient Location: PACU  Anesthesia Type:Epidural  Level of Consciousness: awake, alert , and oriented  Airway & Oxygen Therapy: Patient Spontanous Breathing  Post-op Assessment: Report given to RN and Post -op Vital signs reviewed and stable  Post vital signs: Reviewed and stable  Last Vitals:  Vitals Value Taken Time  BP 127/81 11/20/23 1145  Temp    Pulse 118 11/20/23 1147  Resp 25 11/20/23 1147  SpO2 100 % 11/20/23 1147  Vitals shown include unfiled device data.  Last Pain:  Vitals:   11/20/23 1020  TempSrc: Oral  PainSc:          Complications: No notable events documented.

## 2023-11-20 NOTE — Progress Notes (Signed)
 Now recurrent subtle lates - minimal variability. Cervix unchanged. I recommend primary cesarean delivery for nrFHT remote from delivery. Risks discussed including infection, bleeding, damage to surrounding structures, the need for additional procedures including hysterectomy, and the possibility of uterine rupture with neonatal morbidity/mortality, scarring, and abnormal placentation with subsequent pregnancies. Patient agrees to proceed. Gent/clinda/Azithro on call to OR. Has cephalosporin allergy.    Hortense Lyons MD

## 2023-11-20 NOTE — Anesthesia Postprocedure Evaluation (Signed)
 Anesthesia Post Note  Patient: Kimberly Fuentes  Procedure(s) Performed: CESAREAN DELIVERY (Abdomen)     Patient location during evaluation: Mother Baby Anesthesia Type: Epidural Level of consciousness: awake, awake and alert and oriented Pain management: pain level controlled Vital Signs Assessment: post-procedure vital signs reviewed and stable Respiratory status: spontaneous breathing, nonlabored ventilation and respiratory function stable Cardiovascular status: stable Postop Assessment: no headache, no backache, epidural receding, patient able to bend at knees and no signs of nausea or vomiting Anesthetic complications: no   No notable events documented.  Last Vitals:  Vitals:   11/20/23 1230 11/20/23 1250  BP: 119/76 117/82  Pulse: (!) 106 (!) 111  Resp: 19 15  Temp:    SpO2: 100% 98%    Last Pain:  Vitals:   11/20/23 1250  TempSrc:   PainSc: 0-No pain   Pain Goal:                   Erin Havers

## 2023-11-20 NOTE — Progress Notes (Signed)
 Overnight patient has been able to rest.  FHT overall ok - at times has had some late decelerations and some periods of minimal variability. However, lates are not recurrent and variability is now moderate.  SVE 4/90/-2, AROM copious thick, particulate meconium. IUPC placed. I suspect OP Presentation.  D/w pt my concerns. I suggest position changes.  Monitor closely.    Hortense Lyons MD

## 2023-11-21 ENCOUNTER — Encounter (HOSPITAL_COMMUNITY): Payer: Self-pay | Admitting: Obstetrics and Gynecology

## 2023-11-21 LAB — CBC
HCT: 32.3 % — ABNORMAL LOW (ref 36.0–46.0)
Hemoglobin: 10.2 g/dL — ABNORMAL LOW (ref 12.0–15.0)
MCH: 25.6 pg — ABNORMAL LOW (ref 26.0–34.0)
MCHC: 31.6 g/dL (ref 30.0–36.0)
MCV: 81 fL (ref 80.0–100.0)
Platelets: 235 10*3/uL (ref 150–400)
RBC: 3.99 MIL/uL (ref 3.87–5.11)
RDW: 16.2 % — ABNORMAL HIGH (ref 11.5–15.5)
WBC: 15 10*3/uL — ABNORMAL HIGH (ref 4.0–10.5)
nRBC: 0 % (ref 0.0–0.2)

## 2023-11-21 NOTE — Progress Notes (Signed)
 Postpartum Progress Note  Postpartum Day 1 s/p primary Cesarean section.  Subjective:  Patient reports no overnight events.  She reports well controlled pain, ambulating without difficulty, voiding spontaneously, tolerating PO.  She reports Positive flatus, Negative BM.  Vaginal bleeding is appropriate.  Objective: Blood pressure 99/75, pulse 79, temperature 98.1 F (36.7 C), temperature source Oral, resp. rate 18, height 5' 2.76" (1.594 m), weight 89.5 kg, last menstrual period 02/03/2023, SpO2 99%, unknown if currently breastfeeding.  Physical Exam:  General: alert and no distress Lochia: appropriate Abdomen: soft, ATTP Uterine Fundus: firm Incision: dressing in place DVT Evaluation: No evidence of DVT seen on physical exam.  Recent Labs    11/20/23 0108 11/21/23 0519  HGB 9.4* 10.2*  HCT 29.2* 32.3*    Assessment/Plan: Postpartum Day 1, s/p C-section gHTN  BP well controlled without antihypertensives.  Lactation following Baby boy - desires circ. Will perform if cleared by nursery. Doing well, continue routine postpartum care. Anticipate discharge home tomorrow   LOS: 2 days   Gretchen Leavell 11/21/2023, 7:55 AM

## 2023-11-21 NOTE — Progress Notes (Signed)
 CSW received consult for hx of Anxiety/Depression and OCD/Trichotillomania. CSW met with MOB to offer support and complete assessment. CSW entered the room, introduced herself and acknowledged that her family was present. MOB gave CSW verbal permission to speak about anything while her family was present. CSW explained her role and the reason for the visit. MOB presented eating bedside as the infant laid safely in his bassinet. MOB was polite, easy to engage, receptive to meeting with CSW, and appeared forthcoming.  CSW inquired about MOB's mental health history. 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 MOB reported the OCD/Trichotillomania she was able to participate in therapy at a young age and eventually she was able to manage her symptoms as she got older and understands her triggers. MOB reported being prescribed Lexapro in support of her Anxiety/Depression in the past; however denied current use. MOB reported her mental health during her pregnancy was stable and although the delivery didn't go as a planned she is excited for motherhood. MOB reported a large  support system with her family as her mom and mom's fiance present at bedside during the assessment. CSW provided education regarding the baby blues period vs. perinatal mood disorders, discussed treatment and gave resources for mental health follow up if concerns arise.  CSW recommends self-evaluation during the postpartum time period using the New Mom Checklist from Postpartum Progress and encouraged MOB to contact a medical professional if symptoms are noted at any time.  CSW assessed for safety with MOB SI/HI/DV;MOB denied all.  CSW asked MOB has she selected a pediatrician for the infant's follow up visits; MOB said [facility]. MOB reported having all essential items for the infant including a carseat, bassinet and crib for safe sleeping. CSW  provided review of Sudden Infant Death Syndrome (SIDS) precautions.  CSW identifies no further need for intervention and no barriers to discharge at this time.  Jenney Modest, Milinda Allen Clinical Social Worker [phone]

## 2023-11-22 LAB — SURGICAL PATHOLOGY

## 2023-11-22 NOTE — Progress Notes (Signed)
 Subjective: Postpartum Day 2: Cesarean Delivery Patient reports tolerating PO, + flatus, and no problems voiding.    Objective: Vital signs in last 24 hours: Temp:  [97.8 F (36.6 C)-98.3 F (36.8 C)] 98.3 F (36.8 C) (04/29 0615) Pulse Rate:  [76-92] 92 (04/29 0615) Resp:  [16-18] 17 (04/29 0615) BP: (102-108)/(69-75) 108/69 (04/29 0615) SpO2:  [99 %] 99 % (04/29 0615)  Physical Exam:  General: alert, cooperative, and no distress Lochia: appropriate Uterine Fundus: firm Incision: healing well DVT Evaluation: No evidence of DVT seen on physical exam.  Recent Labs    11/20/23 0108 11/21/23 0519  HGB 9.4* 10.2*  HCT 29.2* 32.3*    Assessment/Plan: Status post Cesarean section. Doing well postoperatively.  Continue current care.  Rudolm Coss II, MD 11/22/2023, 7:17 AM

## 2023-11-22 NOTE — Lactation Note (Signed)
 This note was copied from a baby's chart. Lactation Consultation Note  Patient Name: Kimberly Fuentes [id number] Date: 11/22/2023 Age:29 hours  Reason for consult: Follow-up assessment;Primapara;1st time breastfeeding;Term  P1, [redacted]w[redacted]d, 4% weight loss  Follow up LC visit. Mother receptive to [facility] assistance. Baby is crying and fussy. Mother is unsure about continuing to breast feed but wants assistance with latching. Baby was calmed and placed at breast. He attempted to latch a few times but arched back, rooting and crying. Mother wanted to stop and give him formula. Baby was formula fed and tolerated well.   Mother requesting to pump. She is thinking she would prefer to pump and bottle feed for now and maybe work on breastfeeding once home. DEBP was set up and  mother was instructed on the use, frequency, cleaning of breast pump and storage of breast milk. Mother was sized with 18 mm flanges. She was given coconut oil with instructions. Mother pumped while LC was in the room. Mother said that both latching and pumping feel "different". She is unsure if she thinks that pumping is uncomfortable or just that her nipples are sensitive. Mother informed that we will support her however she decides to feed her baby.   Discussed milk production:  Discussed the process of milk production, "supply and demand" and the importance of breast stimulation and milk removal in order to make an optimal milk supply. Discussed mother to breastfeed 8-12 times in 24 hours, skin to skin and breast feed before formula feeding.   If missed feedings at breast or substituting feeding with formula, advised to hand express and/or pump to remove milk from the breast.  Mother advised if she is exclusively pumping to pump 8 times/ 24 hrs to stimulate and establish milk supply. Until milk is expressed, to follow formula feeding guidelines for feeding amounts.  Mother encouraged to call for assistance form LC and nursing staff as  needed.      Feeding Mother's Current Feeding Choice: Breast Milk and Formula Nipple Type: Slow - flow  LATCH Score Latch: Repeated attempts needed to sustain latch, nipple held in mouth throughout feeding, stimulation needed to elicit sucking reflex.  Audible Swallowing: None  Type of Nipple: Everted at rest and after stimulation  Comfort (Breast/Nipple): Soft / non-tender  Hold (Positioning): Full assist, staff holds infant at breast  LATCH Score: 5   Lactation Tools Discussed/Used Tools: Pump;Flanges Flange Size: 18 Breast pump type: Double-Electric Breast Pump Pump Education: Setup, frequency, and cleaning;Milk Storage Reason for Pumping: mother wants to pump and bottle feed Pumping frequency: pump every 3 hrs for 15 min in intiation phase, dispose of colostrum collectors at 24 hrs or when expressing >7 ml  Interventions Interventions: Breast feeding basics reviewed;Assisted with latch;Skin to skin;Hand express;Adjust position;Support pillows;DEBP;Education;CDC milk storage guidelines;CDC Guidelines for Breast Pump Cleaning  Discharge Pump: Hands Free;Manual (Mom Cozy hands free)  Consult Status Consult Status: Follow-up Date: 11/23/23 Follow-up type: In-patient    [name] 11/22/2023, 12:29 PM

## 2023-11-23 MED ORDER — OXYCODONE HCL 5 MG PO TABS: 5.0000 mg | ORAL_TABLET | ORAL | 0 refills | Status: AC | PRN

## 2023-11-23 MED ORDER — LOPERAMIDE HCL 2 MG PO CAPS
2.0000 mg | ORAL_CAPSULE | ORAL | Status: DC | PRN
  Administered 2023-11-23: 2 mg via ORAL
  Filled 2023-11-23 (×3): qty 1

## 2023-11-23 MED ORDER — IBUPROFEN 600 MG PO TABS: 600.0000 mg | ORAL_TABLET | Freq: Four times a day (QID) | ORAL | 0 refills | Status: AC

## 2023-11-23 NOTE — Discharge Instructions (Signed)
 Call MD for T>100.4, heavy vaginal bleeding, severe abdominal pain, intractable nausea and/or vomiting, or respiratory distress.  Call office to schedule postpartum appointment in 6 weeks.  Pelvic rest and no heavy lifting x 6 weeks.  No driving while taking narcotics.

## 2023-11-23 NOTE — Progress Notes (Signed)
 Subjective: Postpartum Day 3: Cesarean Delivery Patient reports tolerating PO, + flatus, and no problems voiding.  No HA, vision change, RUQ pain, CP/SOB.  Objective: Vital signs in last 24 hours: Temp:  [97.7 F (36.5 C)-98.5 F (36.9 C)] 97.7 F (36.5 C) (04/30 0605) Pulse Rate:  [93-98] 96 (04/30 0605) Resp:  [16-19] 16 (04/30 0605) BP: (109-118)/(74-79) 110/74 (04/30 0605) SpO2:  [98 %-100 %] 98 % (04/29 2216)  Physical Exam:  General: alert, cooperative, and appears stated age Lochia: appropriate Uterine Fundus: firm Incision: healing well, no significant drainage, no dehiscence DVT Evaluation: No evidence of DVT seen on physical exam. Negative Homan's sign. No cords or calf tenderness.  Recent Labs    11/21/23 0519  HGB 10.2*  HCT 32.3*    Assessment/Plan: Status post Cesarean section. Doing well postoperatively.  GHTN-asymptomatic and normal range BPs Discharge home with standard precautions and return to clinic in 4-6 weeks.  Dyanna Glasgow, DO 11/23/2023, 11:58 AM

## 2023-11-23 NOTE — Discharge Summary (Signed)
 Postpartum Discharge Summary  Patient Name: Kimberly Fuentes DOB: Jul 08, 1996 MRN: [id number]  Date of admission: 11/19/2023 Delivery date:11/20/2023 Delivering provider: [name] Date of discharge: 11/23/2023  Admitting diagnosis: Pregnancy [Z34.90] Intrauterine pregnancy: [redacted]w[redacted]d     Secondary diagnosis:  Principal Problem:   Pregnancy  Additional problems: none    Discharge diagnosis: Gestational Hypertension                                              Post partum procedures: n/a Augmentation: AROM and Pitocin Complications: None  Hospital course: Induction of Labor With Cesarean Section   28 y.o. yo G1P1001 at [redacted]w[redacted]d was admitted to the hospital 11/19/2023 for induction of labor. Patient had a labor course significant for  NRFHTs. The patient went for cesarean section due to Non-Reassuring FHR. Delivery details are as follows: Membrane Rupture Time/Date: 7:39 AM,11/20/2023  Delivery Method:C-Section, Low Transverse Operative Delivery:N/A Details of operation can be found in separate operative Note.  Patient had a postpartum course complicated by n/a. She is ambulating, tolerating a regular diet, passing flatus, and urinating well.  Patient is discharged home in stable condition on 11/23/23.      Newborn Data: Birth date:11/20/2023 Birth time:10:53 AM Gender:Female Living status:Living Apgars:9 ,9  Weight:3460 g                               Magnesium Sulfate received: No BMZ received: No Rhophylac:No MMR:No T-DaP:Given prenatally Flu: No RSV Vaccine received: No Transfusion:No  Immunizations received: Immunization History  Administered Date(s) Administered   HPV 9-valent 12/04/2019, 01/08/2020, 12/29/2020   Influenza,inj,Quad PF,6+ Mos 05/01/2015, 04/14/2016   PPD Test 11/10/2015, 04/14/2016, 04/21/2016   Tdap 02/23/2007, 04/14/2016   Unspecified SARS-COV-2 Vaccination 08/31/2019, 10/11/2019    Physical exam  Vitals:   11/22/23 0615 11/22/23  1428 11/22/23 2216 11/23/23 0605  BP: 108/69 109/79 118/76 110/74  Pulse: 92 98 93 96  Resp: 17 18 19 16   Temp: 98.3 F (36.8 C) 98.4 F (36.9 C) 98.5 F (36.9 C) 97.7 F (36.5 C)  TempSrc: Oral Oral Oral Oral  SpO2: 99% 100% 98%   Weight:      Height:       General: alert, cooperative, and no distress Lochia: appropriate Uterine Fundus: firm Incision: Healing well with no significant drainage, No significant erythema, Dressing is clean, dry, and intact DVT Evaluation: No evidence of DVT seen on physical exam. Negative Homan's sign. No cords or calf tenderness. Labs: Lab Results  Component Value Date   WBC 15.0 (H) 11/21/2023   HGB 10.2 (L) 11/21/2023   HCT 32.3 (L) 11/21/2023   MCV 81.0 11/21/2023   PLT 235 11/21/2023      Latest Ref Rng & Units 11/19/2023   12:21 PM  CMP  Glucose 70 - 99 mg/dL 82   BUN 6 - 20 mg/dL 8   Creatinine 4.09 - 8.11 mg/dL 9.14   Sodium 782 - 956 mmol/L 136   Potassium 3.5 - 5.1 mmol/L 4.4   Chloride 98 - 111 mmol/L 103   CO2 22 - 32 mmol/L 22   Calcium 8.9 - 10.3 mg/dL 9.6   Total Protein 6.5 - 8.1 g/dL 5.8   Total Bilirubin 0.0 - 1.2 mg/dL 0.8   Alkaline Phos 38 - 126 U/L 174  AST 15 - 41 U/L 29   ALT 0 - 44 U/L 18    Edinburgh Score:    11/20/2023    1:15 PM  Edinburgh Postnatal Depression Scale Screening Tool  I have been able to laugh and see the funny side of things. 0  I have looked forward with enjoyment to things. 0  I have blamed myself unnecessarily when things went wrong. 1  I have been anxious or worried for no good reason. 2  I have felt scared or panicky for no good reason. 0  Things have been getting on top of me. 1  I have been so unhappy that I have had difficulty sleeping. 1  I have felt sad or miserable. 1  I have been so unhappy that I have been crying. 0  The thought of harming myself has occurred to me. 0  Edinburgh Postnatal Depression Scale Total 6   No data recorded  After visit meds:  Allergies as  of 11/23/2023       Reactions   Cefazolin Hives   Penicillins Hives   Hydrocodone Itching   Facial itching when taking after wisdom teeth extraction   Wound Dressing Adhesive Itching, Rash        Medication List     STOP taking these medications    ondansetron  4 MG disintegrating tablet Commonly known as: ZOFRAN -ODT       TAKE these medications    ibuprofen  600 MG tablet Commonly known as: ADVIL  Take 1 tablet (600 mg total) by mouth every 6 (six) hours.   oxyCODONE 5 MG immediate release tablet Commonly known as: Oxy IR/ROXICODONE Take 1 tablet (5 mg total) by mouth every 4 (four) hours as needed for up to 7 days for moderate pain (pain score 4-6).   prenatal multivitamin Tabs tablet Take 1 tablet by mouth daily at 12 noon.         Discharge home in stable condition Infant Feeding: Breast Infant Disposition:home with mother Discharge instruction: per After Visit Summary and Postpartum booklet. Activity: Advance as tolerated. Pelvic rest for 6 weeks.  Diet: routine diet Future Appointments: Future Appointments  Date Time Provider Department Center  02/24/2024  2:00 PM Napolean Backbone A, DO [facility]   Follow up Visit: 6 weeks PPV  11/23/2023 Dyanna Glasgow, DO

## 2023-11-23 NOTE — Lactation Note (Signed)
 This note was copied from a baby's chart. Lactation Consultation Note  Patient Name: Kimberly Fuentes [id number] Date: 11/23/2023 Age:29 hours Reason for consult: Follow-up assessment;Primapara;1st time breastfeeding;Infant weight loss;Term;Difficult latch (1 % weight loss,) Per mom breast are fuller and warmer with some knots on the sides . LC reassured mom that is a good sign her milk is coming in . Per mom has not been able to pump much with the DEBP and still hasn't gotten anything. LC reassured mom that is normal and its important if the baby isn't latching need to be pumping consistently around the clock like feedings.  Per mom is I can't latch I'm ok with pumping and bottle feeding.  LC reviewed engorgement prevention and tx , Supply and demand ,  Signs to call her doctor for mastitis and baby .   Maternal Data    Feeding Mother's Current Feeding Choice: Breast Milk and Formula Nipple Type: Slow - flow  LATCH Score Latch: Repeated attempts needed to sustain latch, nipple held in mouth throughout feeding, stimulation needed to elicit sucking reflex.  Audible Swallowing: None  Type of Nipple: Everted at rest and after stimulation (areola edema , compressible)  Comfort (Breast/Nipple): Filling, red/small blisters or bruises, mild/mod discomfort  Hold (Positioning): Assistance needed to correctly position infant at breast and maintain latch.  LATCH Score: 5   Lactation Tools Discussed/Used Tools: Coconut oil;Pump Flange Size: 18;21 Breast pump type: Double-Electric Breast Pump Pump Education: Setup, frequency, and cleaning;Milk Storage  Interventions Interventions: Breast feeding basics reviewed;Assisted with latch;Skin to skin;Breast massage;Hand express;Reverse pressure;Adjust position;Support pillows;Position options;Hand pump;DEBP;Education;LC Services brochure;CDC Guidelines for Breast Pump Cleaning;CDC milk storage guidelines  Discharge Discharge Education:  Engorgement and breast care;Warning signs for feeding baby;Outpatient recommendation;Other (comment) (per mom is going to [facility] and will F/u wth the LC there) Pump: Personal;Hands Free;Manual  Consult Status Consult Status: Complete Date: 11/23/23    [name] 11/23/2023, 11:09 AM

## 2023-12-03 ENCOUNTER — Telehealth (HOSPITAL_COMMUNITY): Payer: Self-pay

## 2023-12-03 NOTE — Telephone Encounter (Signed)
 12/03/2023 1432  Name: Kimberly Fuentes MRN: [id number] DOB: 04/25/1996  Reason for Call:  Transition of Care Hospital Discharge Call  Contact Status: Patient Contact Status: Message  Language assistant needed:          Follow-Up Questions:    [name] Postnatal Depression Scale:  In the Past 7 Days:    PHQ2-9 Depression Scale:     Discharge Follow-up:    Post-discharge interventions: NA  Signature  Wadell Guild

## 2023-12-22 DIAGNOSIS — Z9889 Other specified postprocedural states: Secondary | ICD-10-CM | POA: Diagnosis not present

## 2023-12-22 DIAGNOSIS — Z309 Encounter for contraceptive management, unspecified: Secondary | ICD-10-CM | POA: Diagnosis not present

## 2023-12-22 DIAGNOSIS — Z1389 Encounter for screening for other disorder: Secondary | ICD-10-CM | POA: Diagnosis not present

## 2024-01-05 DIAGNOSIS — Z3202 Encounter for pregnancy test, result negative: Secondary | ICD-10-CM | POA: Diagnosis not present

## 2024-01-05 DIAGNOSIS — Z30430 Encounter for insertion of intrauterine contraceptive device: Secondary | ICD-10-CM | POA: Diagnosis not present

## 2024-02-23 NOTE — Patient Instructions (Signed)

## 2024-02-23 NOTE — Progress Notes (Signed)
   Provider needed to cancel appt.

## 2024-02-24 ENCOUNTER — Ambulatory Visit: Payer: 59 | Admitting: Family Medicine

## 2024-02-24 DIAGNOSIS — Z0000 Encounter for general adult medical examination without abnormal findings: Secondary | ICD-10-CM

## 2024-02-27 ENCOUNTER — Encounter: Payer: Self-pay | Admitting: Family Medicine

## 2024-02-27 ENCOUNTER — Ambulatory Visit (INDEPENDENT_AMBULATORY_CARE_PROVIDER_SITE_OTHER): Admitting: Family Medicine

## 2024-02-27 ENCOUNTER — Telehealth: Payer: Self-pay

## 2024-02-27 VITALS — BP 110/72 | HR 75 | Temp 98.2°F | Ht 62.3 in | Wt 172.8 lb

## 2024-02-27 DIAGNOSIS — Z0000 Encounter for general adult medical examination without abnormal findings: Secondary | ICD-10-CM

## 2024-02-27 DIAGNOSIS — Z131 Encounter for screening for diabetes mellitus: Secondary | ICD-10-CM

## 2024-02-27 DIAGNOSIS — Z13220 Encounter for screening for lipoid disorders: Secondary | ICD-10-CM | POA: Diagnosis not present

## 2024-02-27 DIAGNOSIS — R7989 Other specified abnormal findings of blood chemistry: Secondary | ICD-10-CM

## 2024-02-27 NOTE — Patient Instructions (Addendum)
 Return in about 1 year (around 02/27/2025) for Routine chronic condition follow-up.        Great to see you today.  I have refilled the medication(s) we provide.   If labs were collected or images ordered, we will inform you of  results once we have received them and reviewed. We will contact you either by echart message, or telephone call.  Please give ample time to the testing facility, and our office to run,  receive and review results. Please do not call inquiring of results, even if you can see them in your chart. We will contact you as soon as we are able. If it has been over 1 week since the test was completed, and you have not yet heard from us , then please call us .    - echart message- for normal results that have been seen by the patient already.   - telephone call: abnormal results or if patient has not viewed results in their echart.  If a referral to a specialist was entered for you, please call us  in 2 weeks if you have not heard from the specialist office to schedule.

## 2024-02-27 NOTE — Progress Notes (Signed)
 Patient ID: Kimberly Fuentes, female  DOB: Aug 28, 1995, 28 y.o.   MRN: [id number] Patient Care Team    Relationship Specialty Notifications Start End  Catherine Charlies LABOR, DO PCP - General Family Medicine  02/23/23   Marne Kelly Nest, MD Consulting Physician Obstetrics and Gynecology  09/10/16   Verta Royden DASEN, [place] Consulting Physician Podiatry  02/22/23     Chief Complaint  Patient presents with   Annual Exam    Subjective:  Kimberly Fuentes is a 28 y.o.  Female  present for CPE All past medical history, surgical history, allergies, family history, immunizations, medications and social history were updated in the electronic medical record today. All recent labs, ED visits and hospitalizations within the last year were reviewed.  Health maintenance:  Colon cancer screen: No family history.  Routine screening at 44  Mammogram: No family history, routine screening at 42  Cervical cancer screening: last pap: 02/02/2023-Dr. Marne Immunizations: tdap UTD 2017, Influenza  (encouraged yearly) Infectious disease screening: HIV and Hep C completed 2024 with pregnancy DEXA: Routine screening Patient has a Dental home. Hospitalizations/ED visits: Reviewed  C-section 11/20/2023.     02/27/2024    2:02 PM 02/23/2023    1:16 PM 08/22/2017    1:24 PM 04/14/2016    1:09 PM  Depression screen PHQ 2/9  Decreased Interest 0 0 0 0  Down, Depressed, Hopeless 0 0 0 0  PHQ - 2 Score 0 0 0 0       No data to display          Immunization History  Administered Date(s) Administered   HPV 9-valent 12/04/2019, 01/08/2020, 12/29/2020   HPV Quadrivalent 12/29/2020   Influenza,inj,Quad PF,6+ Mos 05/01/2015, 04/14/2016   PPD Test 11/10/2015, 04/14/2016, 04/21/2016, 04/18/2020   Tdap 02/23/2007, 04/14/2016   Unspecified SARS-COV-2 Vaccination 08/31/2019, 10/11/2019    Past Medical History:  Diagnosis Date   Anxiety    Chronic nonintractable headache 02/03/2018   Depression    IBS  (irritable bowel syndrome)    Seasonal allergies    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   UTI (lower urinary tract infection)    Allergies  Allergen Reactions   Cefazolin Hives   Penicillins Hives   Hydrocodone Itching    Facial itching when taking after wisdom teeth extraction   Wound Dressing Adhesive Itching and Rash   Past Surgical History:  Procedure Laterality Date   CESAREAN SECTION N/A 11/20/2023   Procedure: CESAREAN DELIVERY;  Surgeon: Marne Kelly Nest, MD;  Location: [facility] LD ORS;  Service: Obstetrics;  Laterality: N/A;   TONSILLECTOMY AND ADENOIDECTOMY  2000   WISDOM TOOTH EXTRACTION  2012   Family History  Problem Relation Age of Onset   Miscarriages / [place] Mother    Diabetes Father    Social History   Social History Narrative   Marital status/children/pets: single   Education/employment: employed   Field seismologist:      -Wears a bicycle helmet riding a bike: Yes     -smoke alarm in the home:Yes     - wears seatbelt: Yes     - Feels safe in their relationships: Yes                      Allergies as of 02/27/2024       Reactions   Cefazolin Hives   Penicillins Hives   Hydrocodone Itching   Facial itching when taking after wisdom teeth extraction   Wound Dressing Adhesive Itching, Rash  Medication List        Accurate as of February 27, 2024  3:57 PM. If you have any questions, ask your nurse or doctor.          STOP taking these medications    prenatal multivitamin Tabs tablet Stopped by: Charlies Bellini       TAKE these medications    ibuprofen  600 MG tablet Commonly known as: ADVIL  Take 1 tablet (600 mg total) by mouth every 6 (six) hours.        All past medical history, surgical history, allergies, family history, immunizations andmedications were updated in the EMR today and reviewed under the history and medication portions of their EMR.     No results found for this or any previous visit (from the past 2160 hours).  No  results found.   ROS 14 pt review of systems performed and negative (unless mentioned in an HPI)  Objective: BP 110/72   Pulse 75   Temp 98.2 F (36.8 C)   Ht 5' 2.3 (1.582 m)   Wt 172 lb 12.8 oz (78.4 kg)   LMP 02/26/2024 (Exact Date)   SpO2 98%   BMI 31.30 kg/m  Physical Exam Vitals and nursing note reviewed.  Constitutional:      General: She is not in acute distress.    Appearance: Normal appearance. She is not ill-appearing or toxic-appearing.  HENT:     Head: Normocephalic and atraumatic.     Right Ear: Tympanic membrane, ear canal and external ear normal. There is no impacted cerumen.     Left Ear: Tympanic membrane, ear canal and external ear normal. There is no impacted cerumen.     Nose: No congestion or rhinorrhea.     Mouth/Throat:     Mouth: Mucous membranes are moist.     Pharynx: Oropharynx is clear. No oropharyngeal exudate or posterior oropharyngeal erythema.  Eyes:     General: No scleral icterus.       Right eye: No discharge.        Left eye: No discharge.     Extraocular Movements: Extraocular movements intact.     Conjunctiva/sclera: Conjunctivae normal.     Pupils: Pupils are equal, round, and reactive to light.  Cardiovascular:     Rate and Rhythm: Normal rate and regular rhythm.     Pulses: Normal pulses.     Heart sounds: Normal heart sounds. No murmur heard.    No friction rub. No gallop.  Pulmonary:     Effort: Pulmonary effort is normal. No respiratory distress.     Breath sounds: Normal breath sounds. No stridor. No wheezing, rhonchi or rales.  Chest:     Chest wall: No tenderness.  Abdominal:     General: Abdomen is flat. Bowel sounds are normal. There is no distension.     Palpations: Abdomen is soft. There is no mass.     Tenderness: There is no abdominal tenderness. There is no right CVA tenderness, left CVA tenderness, guarding or rebound.     Hernia: No hernia is present.  Musculoskeletal:        General: No swelling,  tenderness or deformity. Normal range of motion.     Cervical back: Normal range of motion and neck supple. No rigidity or tenderness.     Right lower leg: No edema.     Left lower leg: No edema.  Lymphadenopathy:     Cervical: No cervical adenopathy.  Skin:    General: Skin is warm  and dry.     Coloration: Skin is not jaundiced or pale.     Findings: No bruising, erythema, lesion or rash.  Neurological:     General: No focal deficit present.     Mental Status: She is alert and oriented to person, place, and time. Mental status is at baseline.     Cranial Nerves: No cranial nerve deficit.     Sensory: No sensory deficit.     Motor: No weakness.     Coordination: Coordination normal.     Gait: Gait normal.     Deep Tendon Reflexes: Reflexes normal.  Psychiatric:        Mood and Affect: Mood normal.        Behavior: Behavior normal.        Thought Content: Thought content normal.        Judgment: Judgment normal.      No results found.  Assessment/plan: [place] P Felker is a 28 y.o. female present for CPE Routine general medical examination at a health care facility Colon cancer screen: No family history.  Routine screening at 45  Mammogram: No family history, routine screening at 74  Cervical cancer screening: last pap: 02/02/2023-Dr. Marne Immunizations: tdap UTD 2017, Influenza  (encouraged yearly) Infectious disease screening: HIV and Hep C completed 2024 with pregnancy DEXA: Routine screening Patient was encouraged to exercise greater than 150 minutes a week. Patient was encouraged to choose a diet filled with fresh fruits and vegetables, and lean meats. AVS provided to patient today for education/recommendation on gender specific health and safety maintenance.      Return in about 1 year (around 02/27/2025) for Routine chronic condition follow-up.   Orders Placed This Encounter  Procedures   CBC   Comprehensive metabolic panel with GFR   Hemoglobin A1c   Lipid  panel   TSH   No orders of the defined types were placed in this encounter.  Referral Orders  No referral(s) requested today     Electronically signed by: Charlies Bellini, DO [facility]

## 2024-02-27 NOTE — Telephone Encounter (Signed)
 Communication  Patient/patient representative is calling for information confirming an appointment.    Patient stated that she'll be in prior to 1:50 and aware of grace period   Noted.

## 2024-02-28 ENCOUNTER — Ambulatory Visit: Payer: Self-pay | Admitting: Family Medicine

## 2024-02-28 LAB — COMPREHENSIVE METABOLIC PANEL WITH GFR
ALT: 40 IU/L — ABNORMAL HIGH (ref 0–32)
AST: 30 IU/L (ref 0–40)
Albumin: 4.6 g/dL (ref 4.0–5.0)
Alkaline Phosphatase: 144 IU/L — ABNORMAL HIGH (ref 44–121)
BUN/Creatinine Ratio: 15 (ref 9–23)
BUN: 16 mg/dL (ref 6–20)
Bilirubin Total: 0.4 mg/dL (ref 0.0–1.2)
CO2: 22 mmol/L (ref 20–29)
Calcium: 9.7 mg/dL (ref 8.7–10.2)
Chloride: 104 mmol/L (ref 96–106)
Creatinine, Ser: 1.04 mg/dL — ABNORMAL HIGH (ref 0.57–1.00)
Globulin, Total: 2.4 g/dL (ref 1.5–4.5)
Glucose: 83 mg/dL (ref 70–99)
Potassium: 4.4 mmol/L (ref 3.5–5.2)
Sodium: 142 mmol/L (ref 134–144)
Total Protein: 7 g/dL (ref 6.0–8.5)
eGFR: 75 mL/min/1.73 (ref >59)

## 2024-02-28 LAB — CBC
Hematocrit: 42.3 % (ref 34.0–46.6)
Hemoglobin: 13.3 g/dL (ref 11.1–15.9)
MCH: 26 pg — ABNORMAL LOW (ref 26.6–33.0)
MCHC: 31.4 g/dL — ABNORMAL LOW (ref 31.5–35.7)
MCV: 83 fL (ref 79–97)
Platelets: 284 x10E3/uL (ref 150–450)
RBC: 5.12 x10E6/uL (ref 3.77–5.28)
RDW: 15.4 % (ref 11.7–15.4)
WBC: 9.4 x10E3/uL (ref 3.4–10.8)

## 2024-02-28 LAB — LIPID PANEL
Chol/HDL Ratio: 3.5 ratio (ref 0.0–4.4)
Cholesterol, Total: 154 mg/dL (ref 100–199)
HDL: 44 mg/dL (ref >39)
LDL Chol Calc (NIH): 84 mg/dL (ref 0–99)
Triglycerides: 151 mg/dL — ABNORMAL HIGH (ref 0–149)
VLDL Cholesterol Cal: 26 mg/dL (ref 5–40)

## 2024-02-28 LAB — HEMOGLOBIN A1C
Est. average glucose Bld gHb Est-mCnc: 94 mg/dL
Hgb A1c MFr Bld: 4.9 % (ref 4.8–5.6)

## 2024-02-28 LAB — TSH: TSH: 1.79 u[IU]/mL (ref 0.450–4.500)

## 2024-03-12 DIAGNOSIS — H5213 Myopia, bilateral: Secondary | ICD-10-CM | POA: Diagnosis not present

## 2024-03-12 DIAGNOSIS — H52223 Regular astigmatism, bilateral: Secondary | ICD-10-CM | POA: Diagnosis not present

## 2024-05-30 ENCOUNTER — Encounter (INDEPENDENT_AMBULATORY_CARE_PROVIDER_SITE_OTHER): Payer: Self-pay

## 2024-05-30 ENCOUNTER — Other Ambulatory Visit: Payer: Self-pay | Admitting: Medical Genetics

## 2024-06-06 ENCOUNTER — Other Ambulatory Visit (HOSPITAL_COMMUNITY)
Admission: RE | Admit: 2024-06-06 | Discharge: 2024-06-06 | Disposition: A | Discharge status: Home / Self Care | Payer: Self-pay | Source: Ambulatory Visit | Attending: Medical Genetics | Admitting: Medical Genetics

## 2024-06-18 LAB — GENECONNECT MOLECULAR SCREEN: Genetic Analysis Overall Interpretation: NEGATIVE

## 2025-02-27 ENCOUNTER — Encounter: Admitting: Family Medicine
